# Patient Record
Sex: Male | Born: 1995 | Race: White | Hispanic: Yes | Smoking: Former smoker
Health system: Southern US, Community
[De-identification: ages and names within clinical notes are randomized; demographics above are authoritative.]

## PROBLEM LIST (undated history)

## (undated) DIAGNOSIS — I861 Scrotal varices: Secondary | ICD-10-CM

## (undated) DIAGNOSIS — F32A Depression, unspecified: Secondary | ICD-10-CM

## (undated) DIAGNOSIS — F419 Anxiety disorder, unspecified: Secondary | ICD-10-CM

## (undated) DIAGNOSIS — F909 Attention-deficit hyperactivity disorder, unspecified type: Secondary | ICD-10-CM

## (undated) HISTORY — DX: Attention-deficit hyperactivity disorder, unspecified type: F90.9

## (undated) HISTORY — PX: NO PAST SURGERIES: SHX2092

## (undated) HISTORY — DX: Scrotal varices: I86.1

## (undated) HISTORY — DX: Anxiety disorder, unspecified: F41.9

## (undated) HISTORY — DX: Depression, unspecified: F32.A

---
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
## 2016-11-17 ENCOUNTER — Ambulatory Visit (INDEPENDENT_AMBULATORY_CARE_PROVIDER_SITE_OTHER): Payer: No Typology Code available for payment source | Admitting: Urology

## 2016-11-17 ENCOUNTER — Encounter: Payer: Self-pay | Admitting: Urology

## 2016-11-17 VITALS — BP 120/69 | HR 80 | Ht 70.0 in | Wt 198.0 lb

## 2016-11-17 DIAGNOSIS — R3129 Other microscopic hematuria: Secondary | ICD-10-CM

## 2016-11-17 DIAGNOSIS — N453 Epididymo-orchitis: Secondary | ICD-10-CM | POA: Diagnosis not present

## 2016-11-17 DIAGNOSIS — N5082 Scrotal pain: Secondary | ICD-10-CM

## 2016-11-17 DIAGNOSIS — I861 Scrotal varices: Secondary | ICD-10-CM

## 2016-11-17 LAB — URINALYSIS, COMPLETE
BILIRUBIN UA: NEGATIVE
GLUCOSE, UA: NEGATIVE
KETONES UA: NEGATIVE
Leukocytes, UA: NEGATIVE
NITRITE UA: NEGATIVE
Protein, UA: NEGATIVE
SPEC GRAV UA: 1.025 (ref 1.005–1.030)
UUROB: 0.2 mg/dL (ref 0.2–1.0)
pH, UA: 6 (ref 5.0–7.5)

## 2016-11-17 LAB — MICROSCOPIC EXAMINATION
BACTERIA UA: NONE SEEN
Epithelial Cells (non renal): NONE SEEN /hpf (ref 0–10)

## 2016-11-17 LAB — BLADDER SCAN AMB NON-IMAGING: SCAN RESULT: 17

## 2016-11-17 NOTE — Progress Notes (Signed)
11/17/2016 9:02 AM   Gabriel Cooper 24-Aug-1996 [id number]  Referring provider: Renaee Mundaavid A Stein, MD [address] [place], [place] [address]  Chief Complaint  Patient presents with 20  . New Patient (Initial Visit)    Epididymitis referred by Dr. Meredith ModyStein    HPI: Patient is a 20 year old Hispanic male who is referred by Dr. Meredith ModyStein for left epididymitis.  He has been having intermittent left scrotal pain for the last year.  The pain is becoming more frequent and less tolerable.  He states that his left scrotum is bigger than his right scrotum.  He has not noticed any redness.  He has not had any fevers, chills, nausea or vomiting.    He was last sexually active 3 1/2 months ago.  He did not use protection.  He has not any discharge from the penis or dysuria.  He has not had any gross hematuria or suprapubic pain.    He had seen Dr. Edwyna ShellHart in 2016 for similar complaints.    Patient denies any urinary symptoms at this time.  His UA today was 3-10 RBC's.  His PVR 17 mL.     PMH: No past medical history on file.  Surgical History: Past Surgical History:  Procedure Laterality Date  . NO PAST SURGERIES      Home Medications:    Medication List    as of 11/17/2016  9:02 AM   You have not been prescribed any medications.     Allergies: No Known Allergies  Family History: Family History  Problem Relation Age of Onset  . Prostate cancer Neg Hx   . Kidney cancer Neg Hx   . Bladder Cancer Neg Hx     Social History:  reports that he quit smoking about 1 weeks ago. His smoking use included Cigars. His smokeless tobacco use includes Chew. He reports that he drinks alcohol. He reports that he does not use drugs.  ROS: UROLOGY Frequent Urination?: No Hard to postpone urination?: No Burning/pain with urination?: No Get up at night to urinate?: No Leakage of urine?: No Urine stream starts and stops?: No Trouble starting stream?: No Do you have to strain to urinate?: No Blood in  urine?: No Urinary tract infection?: No Sexually transmitted disease?: No Injury to kidneys or bladder?: No Painful intercourse?: No Weak stream?: No Erection problems?: No Penile pain?: No  Gastrointestinal Nausea?: No Vomiting?: No Indigestion/heartburn?: No Diarrhea?: No Constipation?: No  Constitutional Fever: No Night sweats?: No Weight loss?: No Fatigue?: No  Skin Skin rash/lesions?: No Itching?: No  Eyes Blurred vision?: No Double vision?: No  Ears/Nose/Throat Sore throat?: No Sinus problems?: No  Hematologic/Lymphatic Swollen glands?: No Easy bruising?: No  Cardiovascular Leg swelling?: No Chest pain?: No  Respiratory Cough?: No Shortness of breath?: No  Endocrine Excessive thirst?: No  Musculoskeletal Back pain?: No Joint pain?: No  Neurological Headaches?: No Dizziness?: No  Psychologic Depression?: No Anxiety?: No  Physical Exam: BP 120/69   Pulse 80   Ht 5\' 10"  (1.778 m)   Wt 198 lb (89.8 kg)   BMI 28.41 kg/m   Constitutional: Well nourished. Alert and oriented, No acute distress. HEENT: [place] AT, moist mucus membranes. Trachea midline, no masses. Cardiovascular: No clubbing, cyanosis, or edema. Respiratory: Normal respiratory effort, no increased work of breathing. GI: Abdomen is soft, non tender, non distended, no abdominal masses. Liver and spleen not palpable.  No hernias appreciated.  Stool sample for occult testing is not indicated.   GU: No  CVA tenderness.  No bladder fullness or masses.  Patient with uncircumcised phallus.  Foreskin easily retracted  Urethral meatus is patent.  No penile discharge. No penile lesions or rashes. Scrotum without lesions, cysts, rashes and/or edema.  Testicles are located scrotally bilaterally. No masses are appreciated in the testicles. Left and right epididymis are normal.  Left varicocele is noted.   Rectal: Deferred.  Skin: No rashes, bruises or suspicious lesions. Lymph: No cervical or  inguinal adenopathy. Neurologic: Grossly intact, no focal deficits, moving all 4 extremities. Psychiatric: Normal mood and affect.  Laboratory Data:  Urinalysis 3-10 RBC's.  See EPIC.    Pertinent Imaging: Results for Gabriel Cooper (MRN [id number]) as of 11/17/2016 08:57  Ref. Range 11/17/2016 08:51  Scan Result Unknown 17    Assessment & Plan:    1. Left varicocele  - obtain scrotal ultrasound for conformation  - RTC for scrotal ultrasound report  2. Left scrotal pain  - Urinalysis, Complete  - CULTURE, URINE COMPREHENSIVE  - GC/Chlamydia Probe Amp  - BLADDER SCAN AMB NON-IMAGING  3. Microscopic hematuria  - urine sent for culture  - recheck UA upon RTC   Return for RTC for scrotal ultrasound report.  These notes generated with voice recognition software. I apologize for typographical errors.  Michiel CowboySHANNON Davielle Lingelbach, PA-C  [facility] [address] [place], [place] [address] [phone]

## 2016-11-19 LAB — GC/CHLAMYDIA PROBE AMP
CHLAMYDIA, DNA PROBE: NEGATIVE
Neisseria gonorrhoeae by PCR: NEGATIVE

## 2016-11-23 ENCOUNTER — Telehealth: Payer: Self-pay

## 2016-11-23 DIAGNOSIS — N390 Urinary tract infection, site not specified: Secondary | ICD-10-CM

## 2016-11-23 LAB — CULTURE, URINE COMPREHENSIVE

## 2016-11-23 MED ORDER — AMOXICILLIN-POT CLAVULANATE 875-125 MG PO TABS: 1.0000 | ORAL_TABLET | Freq: Two times a day (BID) | ORAL | 0 refills | Status: AC

## 2016-11-23 NOTE — Telephone Encounter (Signed)
-----   Message from Harle BattiestShannon A McGowan, PA-C sent at 11/23/2016  8:04 AM EST ----- Patient has a +UCx.  They need to start Augmentin 875/125, one tablets twice daily for seven days.  They also need to take a probiotic with the antibiotic course.  The dosage is listed below:  L. acidophilus and L. casei (25 x 109 CFU/day for 2 days, then 50 x 109 CFU/day for duration of the antibiotic course)

## 2016-11-23 NOTE — Telephone Encounter (Signed)
Spoke with pt in reference to +ucx. Made aware abx was sent to pharmacy. Pt voiced understanding.  

## 2016-11-24 ENCOUNTER — Ambulatory Visit
Admission: RE | Admit: 2016-11-24 | Discharge: 2016-11-24 | Disposition: A | Discharge status: Home / Self Care | Payer: No Typology Code available for payment source | Source: Ambulatory Visit | Attending: Urology | Admitting: Urology

## 2016-11-24 DIAGNOSIS — I861 Scrotal varices: Secondary | ICD-10-CM | POA: Insufficient documentation

## 2016-12-11 NOTE — Progress Notes (Deleted)
12/12/2016 6:04 PM   Gabriel Cooper [birth year] [id number]  Referring provider: Renaee Munda, MD [address] [place], [place] [address]  No chief complaint on file.   HPI: Patient is a 21 year old Hispanic male who presents today to discuss scrotal ultrasound results.   Background history  Patient was referred by Dr. Meredith Mody for left epididymitis.  He has been having intermittent left scrotal pain for the last year.  The pain is becoming more frequent and less tolerable.  He states that his left scrotum is bigger than his right scrotum.  He has not noticed any redness.  He has not had any fevers, chills, nausea or vomiting.  He was last sexually active 3 1/2 months ago.  He did not use protection.  He has not any discharge from the penis or dysuria.  He has not had any gross hematuria or suprapubic pain.  He had seen Dr. Edwyna Shell in 2016 for similar complaints.  Patient denies any urinary symptoms at this time.   His UA was 3-10 RBC's.  His PVR was 17 mL.    His GC and chlamydia cultures were negative from the last visit. He was found to have a positive urine culture for Staphylococcus hominis and was given 7 days of Augmentin.  Scrotal ultrasound from 11/24/2016 noted a moderate left varicocele, otherwise negative exam.  I have independently reviewed the films.  Today, he ***.        PMH: No past medical history on file.  Surgical History: Past Surgical History:  Procedure Laterality Date  . NO PAST SURGERIES      Home Medications:  Allergies as of 12/12/2016   No Known Allergies     Medication List    as of 12/11/2016  6:04 PM   You have not been prescribed any medications.     Allergies: No Known Allergies  Family History: Family History  Problem Relation Age of Onset  . Prostate cancer Neg Hx   . Kidney cancer Neg Hx   . Bladder Cancer Neg Hx     Social History:  reports that he quit smoking about 5 weeks ago. His smoking use included Cigars. His smokeless  tobacco use includes Chew. He reports that he drinks alcohol. He reports that he does not use drugs.  ROS:                                        Physical Exam: There were no vitals taken for this visit.  Constitutional: Well nourished. Alert and oriented, No acute distress. HEENT: [place] AT, moist mucus membranes. Trachea midline, no masses. Cardiovascular: No clubbing, cyanosis, or edema. Respiratory: Normal respiratory effort, no increased work of breathing. GI: Abdomen is soft, non tender, non distended, no abdominal masses. Liver and spleen not palpable.  No hernias appreciated.  Stool sample for occult testing is not indicated.   GU: No CVA tenderness.  No bladder fullness or masses.  Patient with uncircumcised phallus.  Foreskin easily retracted  Urethral meatus is patent.  No penile discharge. No penile lesions or rashes. Scrotum without lesions, cysts, rashes and/or edema.  Testicles are located scrotally bilaterally. No masses are appreciated in the testicles. Left and right epididymis are normal.  Left varicocele is noted.   Rectal: Deferred.  Skin: No rashes, bruises or suspicious lesions. Lymph: No cervical or inguinal adenopathy. Neurologic: Grossly intact, no focal  deficits, moving all 4 extremities. Psychiatric: Normal mood and affect.  Laboratory Data: Urinalysis  ***    See EPIC.    Pertinent Imaging: CLINICAL DATA:  Pain.  EXAM: SCROTAL ULTRASOUND  DOPPLER ULTRASOUND OF THE TESTICLES  TECHNIQUE: Complete ultrasound examination of the testicles, epididymis, and other scrotal structures was performed. Color and spectral Doppler ultrasound were also utilized to evaluate blood flow to the testicles.  COMPARISON:  No prior.  FINDINGS: Right testicle  Measurements: 4.5 x 2.1 x 3.2 cm. No mass or microlithiasis visualized.  Left testicle  Measurements: 4.4 x 2.3 x 3.1 cm. No mass. Punctate calcifications noted in the left  testicle. These nonspecific.  Right epididymis:  Normal in size and appearance.  Left epididymis:  Normal in size and appearance.  Hydrocele:  None visualized.  Varicocele:  Moderate left varicocele.  Pulsed Doppler interrogation of both testes demonstrates normal low resistance arterial and venous waveforms bilaterally.  IMPRESSION: Moderate left varicocele, otherwise negative exam.   Electronically Signed   By: Maisie Fushomas  Register   On: 11/24/2016 14:52  Assessment & Plan:    1. Left varicocele  - scrotal ultrasound confirms left varicocele  - explained to patient how this may affect fertility  - obtain semen analysis  2. Left scrotal pain  - Urinalysis, Complete   3. UTI  -   4. Microscopic hematuria  - Urinalysis, Complete     No Follow-up on file.  These notes generated with voice recognition software. I apologize for typographical errors.  Michiel CowboySHANNON Aalyssa Elderkin, PA-C  [facility] [address], [address] [place], [place] [address] [phone]

## 2016-12-12 ENCOUNTER — Ambulatory Visit: Payer: No Typology Code available for payment source | Admitting: Urology

## 2016-12-12 ENCOUNTER — Telehealth: Payer: Self-pay | Admitting: Urology

## 2016-12-12 NOTE — Telephone Encounter (Signed)
Patient has a varicocele and this can affect fertility.   I would like him to have a semen analysis.  He also had blood in his urine and I would like to check an UA to make sure that has cleared.

## 2016-12-12 NOTE — Telephone Encounter (Signed)
Pt called and left message to cancel his appt for today for scrotal u/s report.  He didn't leave a message to reschedule.

## 2016-12-12 NOTE — Telephone Encounter (Signed)
Spoke with pt who stated he has rescheduled his appt for 1/10 @ 2:45. Nurse confirmed appt.

## 2016-12-14 ENCOUNTER — Ambulatory Visit: Payer: No Typology Code available for payment source | Admitting: Urology

## 2016-12-22 ENCOUNTER — Ambulatory Visit: Payer: No Typology Code available for payment source | Admitting: Urology

## 2017-01-02 ENCOUNTER — Encounter: Payer: Self-pay | Admitting: Urology

## 2017-01-02 ENCOUNTER — Ambulatory Visit: Payer: No Typology Code available for payment source | Admitting: Urology

## 2017-01-03 ENCOUNTER — Telehealth: Payer: Self-pay

## 2017-01-03 NOTE — Telephone Encounter (Signed)
As per my previous note, patient has a varicocele and this can affect fertility.   I would like him to have a semen analysis.  He also had blood in his urine and I would like to check an UA to make sure that has cleared.

## 2017-01-03 NOTE — Telephone Encounter (Signed)
Pt called stating he missed his results appt yesterday and requested to get scrotal u/s results via telephone. Please advise.

## 2017-01-04 ENCOUNTER — Ambulatory Visit: Payer: No Typology Code available for payment source | Admitting: Urology

## 2017-01-04 NOTE — Telephone Encounter (Signed)
Spoke with pt in reference to varicocele and hematuria. Pt voiced understanding stating he wanted to make an appt to [facility]. Pt was transferred to the front to make f/u appt.

## 2017-01-09 ENCOUNTER — Encounter: Payer: Self-pay | Admitting: Urology

## 2017-01-09 ENCOUNTER — Ambulatory Visit (INDEPENDENT_AMBULATORY_CARE_PROVIDER_SITE_OTHER): Payer: Medicaid Other | Admitting: Urology

## 2017-01-09 VITALS — BP 128/61 | HR 70 | Ht 70.0 in | Wt 194.8 lb

## 2017-01-09 DIAGNOSIS — N5082 Scrotal pain: Secondary | ICD-10-CM | POA: Diagnosis not present

## 2017-01-09 DIAGNOSIS — I861 Scrotal varices: Secondary | ICD-10-CM | POA: Diagnosis not present

## 2017-01-09 DIAGNOSIS — R3129 Other microscopic hematuria: Secondary | ICD-10-CM

## 2017-01-09 LAB — MICROSCOPIC EXAMINATION
BACTERIA UA: NONE SEEN
Epithelial Cells (non renal): NONE SEEN /hpf (ref 0–10)
WBC UA: NONE SEEN /HPF (ref 0–?)

## 2017-01-09 LAB — URINALYSIS, COMPLETE
Bilirubin, UA: NEGATIVE
GLUCOSE, UA: NEGATIVE
Ketones, UA: NEGATIVE
LEUKOCYTES UA: NEGATIVE
Nitrite, UA: NEGATIVE
PH UA: 7.5 (ref 5.0–7.5)
PROTEIN UA: NEGATIVE
Specific Gravity, UA: 1.01 (ref 1.005–1.030)
UUROB: 0.2 mg/dL (ref 0.2–1.0)

## 2017-01-09 NOTE — Progress Notes (Signed)
01/09/2017 3:00 PM   Gabriel Cooper 12/01/1996 [id number]  Referring provider: Renaee Munda, MD [address] [place], [place] [address]  Chief Complaint  Patient presents with  . Follow-up    Vericocele    HPI: Patient is a 21 year old Hispanic male who presents today to discuss scrotal ultrasound results.   Background history  Patient was referred by Dr. Meredith Mody for left epididymitis.  He has been having intermittent left scrotal pain for the last year.  The pain is becoming more frequent and less tolerable.  He states that his left scrotum is bigger than his right scrotum.  He has not noticed any redness.  He has not had any fevers, chills, nausea or vomiting.  He was last sexually active 3 1/2 months ago.  He did not use protection.  He has not any discharge from the penis or dysuria.  He has not had any gross hematuria or suprapubic pain.  He had seen Dr. Edwyna Shell in 2016 for similar complaints.  Patient denies any urinary symptoms at this time.   His UA was 3-10 RBC's.  His PVR was 17 mL.    His GC and chlamydia cultures were negative from the last visit. He was found to have a positive urine culture for Staphylococcus hominis and was given 7 days of Augmentin.  Scrotal ultrasound from 11/24/2016 noted a moderate left varicocele, otherwise negative exam.  I have independently reviewed the films.  Today, he continues to have microscopic hematuria with 3-10 RBC's.  He is having pain in his scrotum.          PMH: No past medical history on file.  Surgical History: Past Surgical History:  Procedure Laterality Date  . NO PAST SURGERIES      Home Medications:  Allergies as of 01/09/2017   No Known Allergies     Medication List    as of 01/09/2017  3:00 PM   You have not been prescribed any medications.     Allergies: No Known Allergies  Family History: Family History  Problem Relation Age of Onset  . Prostate cancer Neg Hx   . Kidney cancer Neg Hx   . Bladder  Cancer Neg Hx     Social History:  reports that he quit smoking about 2 months ago. His smoking use included Cigars. His smokeless tobacco use includes Chew. He reports that he drinks alcohol. He reports that he does not use drugs.  ROS: UROLOGY Frequent Urination?: No Hard to postpone urination?: No Burning/pain with urination?: No Get up at night to urinate?: No Leakage of urine?: No Urine stream starts and stops?: No Trouble starting stream?: No Do you have to strain to urinate?: No Blood in urine?: Yes Urinary tract infection?: No Sexually transmitted disease?: No Injury to kidneys or bladder?: No Painful intercourse?: No Weak stream?: No Erection problems?: No Penile pain?: No  Gastrointestinal Nausea?: No Vomiting?: No Indigestion/heartburn?: No Diarrhea?: No Constipation?: No  Constitutional Fever: No Night sweats?: No Weight loss?: No Fatigue?: No  Skin Skin rash/lesions?: No Itching?: No  Eyes Blurred vision?: No Double vision?: No  Ears/Nose/Throat Sore throat?: No Sinus problems?: No  Hematologic/Lymphatic Swollen glands?: No Easy bruising?: No  Cardiovascular Leg swelling?: No Chest pain?: No  Respiratory Cough?: No Shortness of breath?: No  Endocrine Excessive thirst?: No  Musculoskeletal Back pain?: No Joint pain?: No  Neurological Headaches?: No Dizziness?: No  Psychologic Depression?: No Anxiety?: No  Physical Exam: BP 128/61 (BP Location: Left  Arm, Patient Position: Sitting, Cuff Size: Normal)   Pulse 70   Ht 5\' 10"  (1.778 m)   Wt 194 lb 12.8 oz (88.4 kg)   BMI 27.95 kg/m   Constitutional: Well nourished. Alert and oriented, No acute distress. HEENT:  AT, moist mucus membranes. Trachea midline, no masses. Cardiovascular: No clubbing, cyanosis, or edema. Respiratory: Normal respiratory effort, no increased work of breathing. GI: Abdomen is soft, non tender, non distended, no abdominal masses. Liver and spleen  not palpable.  No hernias appreciated.  Stool sample for occult testing is not indicated.   GU: No CVA tenderness.  No bladder fullness or masses.   Skin: No rashes, bruises or suspicious lesions. Lymph: No cervical or inguinal adenopathy. Neurologic: Grossly intact, no focal deficits, moving all 4 extremities. Psychiatric: Normal mood and affect.  Laboratory Data: Urinalysis  3-10 RBC's.  See EPIC.    Pertinent Imaging: CLINICAL DATA:  Pain.  EXAM: SCROTAL ULTRASOUND  DOPPLER ULTRASOUND OF THE TESTICLES  TECHNIQUE: Complete ultrasound examination of the testicles, epididymis, and other scrotal structures was performed. Color and spectral Doppler ultrasound were also utilized to evaluate blood flow to the testicles.  COMPARISON:  No prior.  FINDINGS: Right testicle  Measurements: 4.5 x 2.1 x 3.2 cm. No mass or microlithiasis visualized.  Left testicle  Measurements: 4.4 x 2.3 x 3.1 cm. No mass. Punctate calcifications noted in the left testicle. These nonspecific.  Right epididymis:  Normal in size and appearance.  Left epididymis:  Normal in size and appearance.  Hydrocele:  None visualized.  Varicocele:  Moderate left varicocele.  Pulsed Doppler interrogation of both testes demonstrates normal low resistance arterial and venous waveforms bilaterally.  IMPRESSION: Moderate left varicocele, otherwise negative exam.   Electronically Signed   By: Maisie Fushomas  Register   On: 11/24/2016 14:52  Assessment & Plan:    1. Left varicocele  - scrotal ultrasound confirms left varicocele  - explained to patient how this may affect fertility  - obtain semen analysis  2. Left scrotal pain  - Urinalysis, Complete   3. UTI  - resolved  4. Microscopic hematuria  - Urinalysis, Complete- continues to have AMH  - obtain a RUS     Return if symptoms worsen or fail to improve.  These notes generated with voice recognition software. I apologize for  typographical errors.  Michiel CowboySHANNON Laverta Harnisch, PA-C  [facility] [address], [address] [place], [place] [address] [phone]

## 2017-01-17 ENCOUNTER — Ambulatory Visit: Payer: Medicaid Other

## 2018-12-03 ENCOUNTER — Emergency Department: Payer: BLUE CROSS/BLUE SHIELD

## 2018-12-03 ENCOUNTER — Encounter: Payer: Self-pay | Admitting: Emergency Medicine

## 2018-12-03 ENCOUNTER — Emergency Department
Admission: EM | Admit: 2018-12-03 | Discharge: 2018-12-03 | Disposition: A | Discharge status: Home / Self Care | Payer: BLUE CROSS/BLUE SHIELD | Attending: Emergency Medicine | Admitting: Emergency Medicine

## 2018-12-03 DIAGNOSIS — W260XXA Contact with knife, initial encounter: Secondary | ICD-10-CM | POA: Insufficient documentation

## 2018-12-03 DIAGNOSIS — Y929 Unspecified place or not applicable: Secondary | ICD-10-CM | POA: Insufficient documentation

## 2018-12-03 DIAGNOSIS — Y9389 Activity, other specified: Secondary | ICD-10-CM | POA: Insufficient documentation

## 2018-12-03 DIAGNOSIS — S61412A Laceration without foreign body of left hand, initial encounter: Secondary | ICD-10-CM | POA: Insufficient documentation

## 2018-12-03 DIAGNOSIS — Y998 Other external cause status: Secondary | ICD-10-CM | POA: Diagnosis not present

## 2018-12-03 DIAGNOSIS — Z87891 Personal history of nicotine dependence: Secondary | ICD-10-CM | POA: Insufficient documentation

## 2018-12-03 DIAGNOSIS — T1490XA Injury, unspecified, initial encounter: Secondary | ICD-10-CM

## 2018-12-03 MED ORDER — LIDOCAINE HCL 1 % IJ SOLN
5.0000 mL | Freq: Once | INTRAMUSCULAR | Status: AC
  Administered 2018-12-03: 5 mL
  Filled 2018-12-03: qty 10

## 2018-12-03 MED ORDER — CEPHALEXIN 500 MG PO CAPS: 500.0000 mg | ORAL_CAPSULE | Freq: Three times a day (TID) | ORAL | 0 refills | Status: AC

## 2018-12-03 NOTE — ED Triage Notes (Addendum)
Pt with approximate 2 inch laceration over the left knuckles after opening box with kitchen steak knife. Bleeding controled. Bandsage applied.

## 2018-12-03 NOTE — ED Notes (Signed)
Reference triage note. Pt currently in NAD. This RN will continue to monitor.

## 2018-12-03 NOTE — ED Provider Notes (Signed)
[facility] Emergency Department Provider Note  ____________________________________________  Time seen: Approximately 11:15 PM  I have reviewed the triage vital signs and the nursing notes.   HISTORY  Chief Complaint Laceration    HPI Gabriel Cooper is a 22 y.o. male presents to the emergency department with a 5 cm laceration along the dorsal aspect of the left hand after patient accidentally cut himself with a clean knife while trying to open a Christmas present.  Patient has been able to move hand since incident occurred.  No numbness or tingling.  Patient's tetanus status is up-to-date.   History reviewed. No pertinent past medical history.  There are no active problems to display for this patient.   Past Surgical History:  Procedure Laterality Date  . NO PAST SURGERIES      Prior to Admission medications   Medication Sig Start Date End Date Taking? Authorizing Provider  cephALEXin (KEFLEX) 500 MG capsule Take 1 capsule (500 mg total) by mouth 3 (three) times daily for 7 days. 12/03/18 12/10/18  Orvil FeilWoods, Mcihael Hinderman M, PA-C    Allergies Patient has no known allergies.  Family History  Problem Relation Age of Onset  . Prostate cancer Neg Hx   . Kidney cancer Neg Hx   . Bladder Cancer Neg Hx     Social History Social History   Tobacco Use  . Smoking status: Former Smoker    Types: Cigars    Last attempt to quit: 11/04/2016    Years since quitting: 2.0  . Smokeless tobacco: Current User    Types: Chew  Substance Use Topics  . Alcohol use: Yes    Alcohol/week: 0.0 standard drinks    Comment: occ  . Drug use: No     Review of Systems  Constitutional: No fever/chills Eyes: No visual changes. No discharge ENT: No upper respiratory complaints. Cardiovascular: no chest pain. Respiratory: no cough. No SOB. Gastrointestinal: No abdominal pain.  No nausea, no vomiting.  No diarrhea.  No constipation. Musculoskeletal: Negative for musculoskeletal  pain. Skin: Patient has left hand laceration.  Neurological: Negative for headaches, focal weakness or numbness.   ____________________________________________   PHYSICAL EXAM:  VITAL SIGNS: ED Triage Vitals  Enc Vitals Group     BP 12/03/18 2021 117/70     Pulse Rate 12/03/18 2021 65     Resp 12/03/18 2021 17     Temp 12/03/18 2021 98 F (36.7 C)     Temp Source 12/03/18 2021 Oral     SpO2 12/03/18 2021 99 %     Weight 12/03/18 2022 215 lb (97.5 kg)     Height 12/03/18 2022 5\' 10"  (1.778 m)     Head Circumference --      Peak Flow --      Pain Score --      Pain Loc --      Pain Edu? --      Excl. in GC? --      Constitutional: Alert and oriented. Well appearing and in no acute distress. Eyes: Conjunctivae are normal. PERRL. EOMI. Head: Atraumatic. Cardiovascular: Normal rate, regular rhythm. Normal S1 and S2.  Good peripheral circulation. Respiratory: Normal respiratory effort without tachypnea or retractions. Lungs CTAB. Good air entry to the bases with no decreased or absent breath sounds. Musculoskeletal: Full range of motion to all extremities.  No flexor or extensor tendon deficits appreciated with testing.  No gross deformities appreciated.  Palpable radial pulse bilaterally and symmetrically. Neurologic:  Normal speech and  language. No gross focal neurologic deficits are appreciated.  Skin: Patient has 5 cm linear laceration along the dorsal aspect of the left hand. Psychiatric: Mood and affect are normal. Speech and behavior are normal. Patient exhibits appropriate insight and judgement.   ____________________________________________   LABS (all labs ordered are listed, but only abnormal results are displayed)  Labs Reviewed - No data to display ____________________________________________  EKG   ____________________________________________  RADIOLOGY I personally viewed and evaluated these images as part of my medical decision making, as well as  reviewing the written report by the radiologist.  Dg Hand Complete Left  Result Date: 12/03/2018 CLINICAL DATA:  Laceration EXAM: LEFT HAND - COMPLETE 3+ VIEW COMPARISON:  None. FINDINGS: Frontal, oblique, and lateral views obtained. No fracture or dislocation. Joint spaces appear normal. No erosive change. No soft tissue air or radiopaque foreign body. IMPRESSION: No soft tissue air or radiopaque foreign body. No fracture or dislocation. No evident arthropathy. Electronically Signed   By: Bretta BangWilliam  Woodruff III M.D.   On: 12/03/2018 20:48    ____________________________________________    PROCEDURES  Procedure(s) performed:    Procedures  LACERATION REPAIR Performed by: Orvil FeilJaclyn M Anadelia Kintz Authorized by: Orvil FeilJaclyn M Shacoya Burkhammer Consent: Verbal consent obtained. Risks and benefits: risks, benefits and alternatives were discussed Consent given by: patient Patient identity confirmed: provided demographic data Prepped and Draped in normal sterile fashion Wound explored  Laceration Location: Left hand   Laceration Length: 5 cm  No Foreign Bodies seen or palpated  Anesthesia: local infiltration  Local anesthetic: lidocaine 1% without epinephrine  Anesthetic total: 5 ml  Irrigation method: syringe Amount of cleaning: standard  Skin closure: 4-0 Ethilon   Number of sutures: 13  Technique: Simple Interrupted   Patient tolerance: Patient tolerated the procedure well with no immediate complications.   Medications  lidocaine (XYLOCAINE) 1 % (with pres) injection 5 mL (5 mLs Infiltration Given by Other 12/03/18 2314)     ____________________________________________   INITIAL IMPRESSION / ASSESSMENT AND PLAN / ED COURSE  Pertinent labs & imaging results that were available during my care of the patient were reviewed by me and considered in my medical decision making (see chart for details).  Review of the [place] CSRS was performed in accordance of the [facility] prior to dispensing any  controlled drugs.      Assessment and plan Laceration repair Patient presents to the emergency department with a 5 cm linear laceration along the dorsal aspect of the left hand sustained accidentally with a knife.  Patient underwent laceration repair in the emergency department without complication.  He was advised to have sutures removed by primary care in 1 week.  Patient was discharged with Keflex.  All patient questions were answered.    ____________________________________________  FINAL CLINICAL IMPRESSION(S) / ED DIAGNOSES  Final diagnoses:  Laceration of left hand without foreign body, initial encounter      NEW MEDICATIONS STARTED DURING THIS VISIT:  ED Discharge Orders         Ordered    cephALEXin (KEFLEX) 500 MG capsule  3 times daily     12/03/18 2312              This chart was dictated using voice recognition software/Dragon. Despite best efforts to proofread, errors can occur which can change the meaning. Any change was purely unintentional.    Orvil FeilWoods, Meri Pelot M, PA-C 12/03/18 2320    Jeanmarie PlantMcShane, James A, MD 12/03/18 [id number]

## 2019-11-15 ENCOUNTER — Ambulatory Visit: Payer: Self-pay | Admitting: Family Medicine

## 2019-12-03 ENCOUNTER — Ambulatory Visit: Payer: Self-pay | Admitting: Family Medicine

## 2019-12-12 ENCOUNTER — Ambulatory Visit: Payer: Self-pay | Admitting: Family Medicine

## 2020-08-02 ENCOUNTER — Encounter: Payer: Self-pay | Admitting: Emergency Medicine

## 2020-08-02 ENCOUNTER — Ambulatory Visit: Admission: EM | Admit: 2020-08-02 | Discharge: 2020-08-02 | Disposition: A | Discharge status: Home / Self Care

## 2020-08-02 ENCOUNTER — Other Ambulatory Visit: Payer: Self-pay

## 2020-08-02 DIAGNOSIS — T148XXA Other injury of unspecified body region, initial encounter: Secondary | ICD-10-CM

## 2020-08-02 DIAGNOSIS — M7918 Myalgia, other site: Secondary | ICD-10-CM

## 2020-08-02 MED ORDER — IBUPROFEN 600 MG PO TABS: 600.0000 mg | ORAL_TABLET | Freq: Four times a day (QID) | ORAL | 0 refills | Status: DC | PRN

## 2020-08-02 MED ORDER — CYCLOBENZAPRINE HCL 10 MG PO TABS: 10.0000 mg | ORAL_TABLET | Freq: Two times a day (BID) | ORAL | 0 refills | Status: DC | PRN
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
## 2021-12-21 ENCOUNTER — Encounter: Payer: Self-pay | Admitting: Family Medicine

## 2021-12-21 ENCOUNTER — Ambulatory Visit
Admission: RE | Admit: 2021-12-21 | Discharge: 2021-12-21 | Disposition: A | Discharge status: Home / Self Care | Payer: 59 | Source: Ambulatory Visit | Attending: Family Medicine | Admitting: Family Medicine

## 2021-12-21 ENCOUNTER — Ambulatory Visit
Admission: RE | Admit: 2021-12-21 | Discharge: 2021-12-21 | Disposition: A | Discharge status: Home / Self Care | Payer: 59 | Attending: Family Medicine | Admitting: Family Medicine

## 2021-12-21 ENCOUNTER — Ambulatory Visit: Payer: 59 | Admitting: Family Medicine

## 2021-12-21 ENCOUNTER — Other Ambulatory Visit: Payer: Self-pay

## 2021-12-21 VITALS — BP 120/68 | HR 88 | Ht 70.0 in | Wt 186.0 lb

## 2021-12-21 DIAGNOSIS — M5416 Radiculopathy, lumbar region: Secondary | ICD-10-CM

## 2021-12-21 DIAGNOSIS — I861 Scrotal varices: Secondary | ICD-10-CM | POA: Diagnosis not present

## 2021-12-21 MED ORDER — MELOXICAM 15 MG PO TABS: 15.0000 mg | ORAL_TABLET | Freq: Every day | ORAL | 0 refills | Status: DC

## 2021-12-21 NOTE — Assessment & Plan Note (Signed)
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  He has noted intermittent pain with this not tied to any specific activity, denies any masses, mild radiation to the thighs, no discoloration or trauma that he recalls.  Examination does reveal increased varicosity, palpation of the left testes without discrete masses, mildly tender, Valsalva without herniations appreciable with mild increase in vein dilatation.  I have advised further evaluation management by urology, referral was placed in that regard today.  We will follow peripherally on this issue.

## 2021-12-21 NOTE — Progress Notes (Signed)
°  ° °  Primary Care / Sports Medicine Office Visit  Patient Information:  Patient ID: Gabriel Cooper, male DOB: [birth year] Age: 26 y.o. MRN: [id number]   Gabriel Cooper is a pleasant 25 y.o. male presenting with the following:  Chief Complaint  Patient presents with   New Patient (Initial Visit)   Establish Care   Back Pain    Vitals:   12/21/21 1504  BP: 120/68  Pulse: 88  SpO2: 97%   Vitals:   12/21/21 1504  Weight: 186 lb (84.4 kg)  Height: 5\' 10"  (1.778 m)   Body mass index is 26.69 kg/m.  No results found.   Independent interpretation of notes and tests performed by another provider:   None  Procedures performed:   None  Pertinent History, Exam, Impression, and Recommendations:   Lumbar radiculopathy Patient with atraumatic roughly 2-year history of symmetric lower back pain with intermittent radiation down the legs.  This pain is aggravated by strenuous activity, lifting, and bending forward at the waist.  Denies any treatments to date.  Examination shows tenderness focal to the left SI joint, positive straight leg raise bilaterally, otherwise benign.  Given patient stated history, findings today, concern for intervertebral disc disorder with associated intermittent neural impingement.  Plan for lumbar spine x-rays, scheduled anti-inflammatory meloxicam for 4 weeks, and close follow-up at that time.  Pending x-ray findings and response, we can determine next steps such as rehab, local injections, advanced imaging.  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  He has noted intermittent pain with this not tied to any specific activity, denies any masses, mild radiation to the thighs, no discoloration or trauma that he recalls.  Examination does reveal increased varicosity, palpation of the left testes without discrete masses, mildly  tender, Valsalva without herniations appreciable with mild increase in vein dilatation.  I have advised further evaluation management by urology, referral was placed in that regard today.  We will follow peripherally on this issue.   Orders & Medications Meds ordered this encounter  Medications   meloxicam (MOBIC) 15 MG tablet    Sig: Take 1 tablet (15 mg total) by mouth daily.    Dispense:  30 tablet    Refill:  0   Orders Placed This Encounter  Procedures   DG Lumbar Spine Complete   Ambulatory referral to Urology     Return in about 4 weeks (around 01/18/2022).     01/20/2022, MD   Primary Care Sports Medicine [facility]

## 2021-12-21 NOTE — Assessment & Plan Note (Signed)
Patient with atraumatic roughly 2-year history of symmetric lower back pain with intermittent radiation down the legs.  This pain is aggravated by strenuous activity, lifting, and bending forward at the waist.  Denies any treatments to date.  Examination shows tenderness focal to the left SI joint, positive straight leg raise bilaterally, otherwise benign.  Given patient stated history, findings today, concern for intervertebral disc disorder with associated intermittent neural impingement.  Plan for lumbar spine x-rays, scheduled anti-inflammatory meloxicam for 4 weeks, and close follow-up at that time.  Pending x-ray findings and response, we can determine next steps such as rehab, local injections, advanced imaging.

## 2021-12-21 NOTE — Patient Instructions (Signed)
-   Obtain low back x-rays - Start meloxicam, take once daily with food - Avoid excessive bending at the waist, twisting, turning activities at the low back - Monitor symptoms/symptom change at the low back and groin - Referral coordinator will contact you follow-up with urology - Return for follow-up in 4 weeks

## 2022-01-11 NOTE — Progress Notes (Incomplete)
° °  01/11/22 4:45 PM   Gabriel Cooper [birth year] [id number]  Referring provider:  Montel Culver, MD [address] [place],  [place] [address] No chief complaint on file.    HPI: Gabriel Cooper is a 26 y.o.male who presents today for further evaluation of variocele.   Scrotal ultrasound from 11/24/2016 noted a moderate left varicocele, otherwise negative exam.  He was previously referred by Dr. Delice Lesch for left epididymitis. He was seen by Zara Council, PA-C.       PMH: Past Medical History:  Diagnosis Date   ADHD (attention deficit hyperactivity disorder)    Anxiety    Depression    Varicocele     Surgical History: No past surgical history on file.  Home Medications:  Allergies as of 01/12/2022   No Known Allergies      Medication List        Accurate as of January 11, 2022  4:45 PM. If you have any questions, ask your nurse or doctor.          meloxicam 15 MG tablet Commonly known as: MOBIC Take 1 tablet (15 mg total) by mouth daily.   VYVANSE PO Take 30 mg by mouth daily.        Allergies: No Known Allergies  Family History: Family History  Problem Relation Age of Onset   Breast cancer Mother    Breast cancer Maternal Grandmother    Parkinson's disease Paternal Grandfather    Allergies Daughter    Prostate cancer Neg Hx    Kidney cancer Neg Hx    Bladder Cancer Neg Hx     Social History:  reports that he quit smoking about 5 years ago. His smoking use included cigars. He has quit using smokeless tobacco.  His smokeless tobacco use included chew. He reports current alcohol use of about 2.0 standard drinks per week. He reports that he does not currently use drugs.   Physical Exam: There were no vitals taken for this visit.  Constitutional:  Alert and oriented, No acute distress. HEENT: [place] AT, moist mucus membranes.  Trachea midline, no masses. Cardiovascular: No clubbing, cyanosis, or edema. Respiratory: Normal respiratory  effort, no increased work of breathing. GU: No CVA tenderness Skin: No rashes, bruises or suspicious lesions. Neurologic: Grossly intact, no focal deficits, moving all 4 extremities. Psychiatric: Normal mood and affect.   Urinalysis   Pertinent Imaging:    Assessment & Plan:     No follow-ups on file.  I,Kailey Littlejohn,acting as a Education administrator for Hollice Espy, MD.,have documented all relevant documentation on the behalf of Hollice Espy, MD,as directed by  Hollice Espy, MD while in the presence of Hollice Espy, [place] [address] [place], [place] [address] [phone]

## 2022-01-12 ENCOUNTER — Ambulatory Visit: Payer: 59 | Admitting: Urology

## 2022-01-18 ENCOUNTER — Ambulatory Visit: Payer: 59 | Admitting: Family Medicine

## 2022-01-18 ENCOUNTER — Other Ambulatory Visit: Payer: Self-pay

## 2022-01-18 ENCOUNTER — Encounter: Payer: Self-pay | Admitting: Family Medicine

## 2022-01-18 VITALS — BP 122/86 | HR 88 | Ht 70.0 in | Wt 188.0 lb

## 2022-01-18 DIAGNOSIS — M5416 Radiculopathy, lumbar region: Secondary | ICD-10-CM

## 2022-01-18 NOTE — Patient Instructions (Addendum)
-   Referral coordinator will contact you in regards to physical therapy scheduling, alternatively can contact the number below for expedited scheduling - Dose meloxicam once daily on an as-needed basis (take with food) - Contact urology to coordinate another follow-up visit - Perform physical therapy exercises and home exercises daily, review your current workout routine with physical therapist - Contact us for any questions or concerns - Return for follow-up in 6 weeks  [facility]:  [place]:  [phone]  [place]: [phone]

## 2022-01-18 NOTE — Progress Notes (Signed)
°  ° °  Primary Care / Sports Medicine Office Visit  Patient Information:  Patient ID: Gabriel Cooper, male DOB: Jul 19, 1996 Age: 26 y.o. MRN: [id number]   Gabriel Cooper is a pleasant 26 y.o. male presenting with the following:  Chief Complaint  Patient presents with   Lumbar radiculopathy    Vitals:   01/18/22 1558  BP: 122/86  Pulse: 88  SpO2: 97%   Vitals:   01/18/22 1558  Weight: 188 lb (85.3 kg)  Height: 5\' 10"  (1.778 m)   Body mass index is 26.98 kg/m.  DG Lumbar Spine Complete  Result Date: 12/22/2021 CLINICAL DATA:  Left-sided back pain for 2 years, initial encounter EXAM: LUMBAR SPINE - COMPLETE 4+ VIEW COMPARISON:  None. FINDINGS: Vertebral body height is well maintained. No pars defects are noted. No anterolisthesis is seen. No soft tissue abnormality is noted. No acute fracture noted. IMPRESSION: No acute abnormality noted. Electronically Signed   By: 12/24/2021 M.D.   On: 12/22/2021 02:40     Independent interpretation of notes and tests performed by another provider:   Independent interpretation of lumbar spine x-rays dated 12/22/2021 reveals subtle left-sided curvature on AP view, secondary to asymmetric muscular spasm versus positioning, otherwise no intervertebral narrowing, facet pathologies noted.  Procedures performed:   None  Pertinent History, Exam, Impression, and Recommendations:   Lumbar radiculopathy Patient is for follow-up to atraumatic roughly 2-year history of symmetric lower back pain with intermittent lower leg radiation/paresthesias.  At the last visit on 12/21/2021 he was advised scheduled anti-inflammatory meloxicam for 4 weeks, lumbar spine x-rays, and close follow-up.  He was able to dose the meloxicam consistently for 3 weeks, did note improvement though without resolution of his symptoms.  His examination today shows improved though persistent focal left SI joint tenderness, positive straight leg raise bilaterally, and otherwise stable.   Lumbar spine films are reassuring with subtle curvature noted on AP view which can be consistent with asymmetric muscular spasm versus positioning.  Given his persistent symptomatology have advised a transition from scheduled meloxicam to as needed dosing, start of formal physical therapy x6 weeks, and a return at the end of PT.  If suboptimal response noted despite adherence to this plan, anticipate advanced imaging and further medication management.  Chronic condition, symptomatic, independent radiographic interpretation, Rx management   Orders & Medications No orders of the defined types were placed in this encounter.  Orders Placed This Encounter  Procedures   Ambulatory referral to Physical Therapy     Return in about 6 weeks (around 03/01/2022).     03/03/2022, MD   Primary Care Sports Medicine [facility]

## 2022-01-18 NOTE — Assessment & Plan Note (Signed)
Patient is for follow-up to atraumatic roughly 2-year history of symmetric lower back pain with intermittent lower leg radiation/paresthesias.  At the last visit on 12/21/2021 he was advised scheduled anti-inflammatory meloxicam for 4 weeks, lumbar spine x-rays, and close follow-up.  He was able to dose the meloxicam consistently for 3 weeks, did note improvement though without resolution of his symptoms.  His examination today shows improved though persistent focal left SI joint tenderness, positive straight leg raise bilaterally, and otherwise stable.  Lumbar spine films are reassuring with subtle curvature noted on AP view which can be consistent with asymmetric muscular spasm versus positioning.  Given his persistent symptomatology have advised a transition from scheduled meloxicam to as needed dosing, start of formal physical therapy x6 weeks, and a return at the end of PT.  If suboptimal response noted despite adherence to this plan, anticipate advanced imaging and further medication management.  Chronic condition, symptomatic, independent radiographic interpretation, Rx management

## 2022-01-26 ENCOUNTER — Ambulatory Visit: Payer: 59

## 2022-01-31 ENCOUNTER — Ambulatory Visit: Payer: 59

## 2022-01-31 ENCOUNTER — Ambulatory Visit: Payer: 59 | Attending: Family Medicine

## 2022-02-02 ENCOUNTER — Ambulatory Visit: Payer: 59

## 2022-02-08 NOTE — Progress Notes (Addendum)
Patient was not seen on this day, canceled his appointment.

## 2022-02-09 ENCOUNTER — Ambulatory Visit (INDEPENDENT_AMBULATORY_CARE_PROVIDER_SITE_OTHER): Payer: 59 | Admitting: Urology

## 2022-02-09 DIAGNOSIS — Z91199 Patient's noncompliance with other medical treatment and regimen due to unspecified reason: Secondary | ICD-10-CM

## 2022-02-13 NOTE — Progress Notes (Incomplete)
? ?  02/13/22 ?6:26 PM  ? ?Sharen Hint ?[birth year] ?[id number] ? ?Referring provider:  ?Montel Culver, MD ?[address] ?[address] ?[place],  [place] [address] ?No chief complaint on file. ? ? ? ?HPI: ?Gabriel Cooper is a 26 y.o.male who presents today for further evaluation of variocele.  ? ?He has a personal history of epididymitis, scrotal pain, left variocele, and microscopic hematuria.  ? ?He was last seen in clinic in 2018 by Zara Council, PA-C, at the time he was having increased left scrotal pain. He had seen Dr. Elnoria Howard in 2016 for similar complaints. Scrotal ultrasound from 11/24/2016 noted a moderate left varicocele, otherwise negative exam.  ? ?   ? ? ? ? ? ?PMH: ?Past Medical History:  ?Diagnosis Date  ? ADHD (attention deficit hyperactivity disorder)   ? Anxiety   ? Depression   ? Varicocele   ? ? ?Surgical History: ?No past surgical history on file. ? ?Home Medications:  ?Allergies as of 02/15/2022   ?No Known Allergies ?  ? ?  ?Medication List  ?  ? ?  ? Accurate as of February 13, 2022  6:26 PM. If you have any questions, ask your nurse or doctor.  ?  ?  ? ?  ? ?meloxicam 15 MG tablet ?Commonly known as: MOBIC ?Take 1 tablet (15 mg total) by mouth daily. ?  ?VYVANSE PO ?Take 30 mg by mouth daily. ?  ? ?  ? ? ?Allergies: No Known Allergies ? ?Family History: ?Family History  ?Problem Relation Age of Onset  ? Breast cancer Mother   ? Breast cancer Maternal Grandmother   ? Parkinson's disease Paternal Grandfather   ? Allergies Daughter   ? Prostate cancer Neg Hx   ? Kidney cancer Neg Hx   ? Bladder Cancer Neg Hx   ? ? ?Social History:  reports that he quit smoking about 5 years ago. His smoking use included cigars. He has quit using smokeless tobacco.  His smokeless tobacco use included chew. He reports current alcohol use of about 2.0 standard drinks per week. He reports that he does not currently use drugs. ? ? ?Physical Exam: ?There were no vitals taken for this visit.  ?Constitutional:  Alert and oriented,  No acute distress. ?HEENT: [place] AT, moist mucus membranes.  Trachea midline, no masses. ?Cardiovascular: No clubbing, cyanosis, or edema. ?Respiratory: Normal respiratory effort, no increased work of breathing. ?Skin: No rashes, bruises or suspicious lesions. ?Neurologic: Grossly intact, no focal deficits, moving all 4 extremities. ?Psychiatric: Normal mood and affect. ? ?Laboratory Data: ? ?No results found for: CREATININE ? ?No results found for: PSA ? ?No results found for: TESTOSTERONE ? ?No results found for: HGBA1C ? ?Urinalysis ? ? ?Pertinent Imaging: ? ? ? ?Assessment & Plan:   ? ? ?No follow-ups on file. ? ?I,Kailey Littlejohn,acting as a scribe for Hollice Espy, MD.,have documented all relevant documentation on the behalf of Hollice Espy, MD,as directed by  Hollice Espy, MD while in the presence of Hollice Espy, MD. ? ?[place] ?[address] ?[place],  [address] ?[phone] ? ?

## 2022-02-15 ENCOUNTER — Ambulatory Visit: Payer: 59 | Admitting: Urology

## 2022-02-21 ENCOUNTER — Ambulatory Visit: Payer: 59 | Attending: Family Medicine

## 2022-02-23 ENCOUNTER — Ambulatory Visit: Payer: 59

## 2022-03-01 ENCOUNTER — Ambulatory Visit: Payer: 59 | Admitting: Family Medicine

## 2022-03-08 ENCOUNTER — Ambulatory Visit: Payer: 59 | Attending: Family Medicine

## 2022-03-08 DIAGNOSIS — M5416 Radiculopathy, lumbar region: Secondary | ICD-10-CM | POA: Diagnosis present

## 2022-03-08 DIAGNOSIS — M5459 Other low back pain: Secondary | ICD-10-CM | POA: Diagnosis present

## 2022-03-08 NOTE — Therapy (Signed)
[place] ?[facility] ?[address] ?[place], [place], [address] ?Phone: [phone]   Fax:  [phone] ? ?Physical Therapy Evaluation ? ?Patient Details  ?Name: Gabriel Cooper ?MRN: [id number] ?Date of Birth: 1996/06/02 ?Referring Provider (PT): Jerrol Banana, MD ? ? ?Encounter Date: 03/08/2022 ? ? PT End of Session - 03/08/22 1549   ? ? Visit Number 1   ? Number of Visits 9   ? Date for PT Re-Evaluation 05/05/22   ? PT Start Time 1549   ? PT Stop Time 1636   ? PT Time Calculation (min) 47 min   ? ?  ?  ? ?  ? ? ?Past Medical History:  ?Diagnosis Date  ? ADHD (attention deficit hyperactivity disorder)   ? Anxiety   ? Depression   ? Varicocele   ? ? ?No past surgical history on file. ? ?There were no vitals filed for this visit. ? ? ? Subjective Assessment - 03/08/22 1553   ? ? Subjective Low back: 0/10 currently, 8/10 at worst for the past 3 months.   ? Pertinent History Lumbar radiculopathy. 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 Pain got progressively worse. Has gotten better lately due to stretches. Used to have really sharp pain in his back when he bends over. Not as bad now. Stretches include standing bent over stretch, quad stretch standing, deep squats, hamstring and piriformis and butter fly stretches. Sometimes gets tingling L medial thigh. No paresthesia at gluteal cleft and no loss of bowel or bladder control. L LE paresthesia includes L medial thigh to back of knee to L lateral leg (around L4-5 dermatomes). Most of his pain is at his L low back.   ? Patient Stated Goals Learn better stretches to help with back pain.   ? Currently in Pain? No/denies   ? Pain Location Back   ? Pain Orientation Left;Lower;Posterior   ? Pain Descriptors / Indicators Aching;Sharp   ? Pain Type Chronic pain   ? Pain Onset More than a month ago   ? Pain Frequency Occasional   ? Aggravating Factors  bending over with his back,  slouching which sitting. Pain is worst in the morning (prone or L S/L)   ? Pain Relieving Factors sleeping on his back   ? ?  ?  ? ?  ? ? ? ? ? [facility] PT Assessment - 03/08/22 1605   ? ?  ? Assessment  ? Medical Diagnosis M54.16 (ICD-10-CM)  Lumbar radiculopathy   ? Referring Provider (PT) Jerrol Banana, MD   ? Onset Date/Surgical Date 01/18/22   Date PT referral signed. Chronic condition  ? Prior Therapy No known PT for current condition   ?  ? Precautions  ? Precaution Comments No known precautions   ?  ? Restrictions  ? Other Position/Activity Restrictions No known restrictions   ?  ? Observation/Other Assessments  ? Focus on Therapeutic Outcomes (FOTO)  Lumbar spine FOTO 62   ?  ? Posture/Postural Control  ? Posture Comments Forward neck, slight R lateral shift, B protracted shoulders, R iliac crest and greater trochanter lower, R hip ER   ?  ? AROM  ? Lumbar Flexion full   ? Lumbar Extension WFL   No pain  ? Lumbar - Right Side [facility] with slight L low back symptoms.   ? Lumbar - Left Side [facility] with slight symptoms   ?  Lumbar - Right Rotation WFL with L low back tightness   ? Lumbar - Left Rotation WFL with increased L low back pain   ?  ? Strength  ? Right Hip Flexion 4/5   ? Right Hip Extension 4-/5   with slight R low back pain  ? Left Hip Flexion 4/5   ? Left Hip Extension 4-/5   with L low back pain  ? Right Knee Flexion 5/5   ? Right Knee Extension 5/5   ? Left Knee Flexion 5/5   ? Left Knee Extension 5/5   ?  ? Palpation  ? Palpation comment TTP L L5 TP to L L1 TP. TTP to S1 CPA   ?  ? Special Tests  ? Other special tests (-) repeated flexion test. (+) long sit test suggesting anterior nutation of L innominate.  (+) Slump test L LE with reproduction of symptoms.   ? ?  ?  ? ?  ? ? ? ? ? ? ? ? ? ? ? ? ? ?Objective measurements completed on examination: See above findings.  ? ? ? ? ? ?No latex allergies ? ?Sitting posture: slight R lumbar convexity.  ? ?Extension preference ? ? ? ? ?Therapeutic  exercise ? ?Supine L hip extension isometrics in SKTC position 10x5 seconds for 2 sets ? ?Improved exercise technique, movement at target joints, use of target muscles after mod verbal, visual, tactile cues.  ? ?Reviewed POC 1x/week for 8 weeks due to schedule ?  ? ? ?Response to treatment ? ?Back feels better after session per pt ? ? ? ?Clinical impression ?Pt is a 26 year old male who came to physical therapy secondary to chronic low back pain. He also presents with altered posture, TTP to low back, bilateral glute weakness, positive special tests suggesting lumbopelvic involvement and neural tension, and difficulty performing tasks which involve bending over as well as sitting for longer periods secondary to low back pain. Pt will benefit from skilled physical therapy services to address the aforementioned deficits.  ? ? ? ? ? ? ? ? ? ? ?Access Code: [email or web address] ?URL: [email or web address] ?Date: 03/08/2022 ?Prepared by: Loralyn Freshwater ? ?Exercises ?- supine single knee to chest position with hip extension isometrics  - 1 x daily - 7 x weekly - 3 sets - 10 reps - 5 seconds  hold ? ? ? ? ? ? ? ? ? ? ? ? ? ? PT Education - 03/08/22 1752   ? ? Education Details ther-ex, HEP, POC   ? Person(s) Educated Patient   ? Methods Explanation;Demonstration;Tactile cues;Verbal cues;Handout   ? Comprehension Returned demonstration;Verbalized understanding   ? ?  ?  ? ?  ? ? ? PT Short Term Goals - 03/08/22 1747   ? ?  ? PT SHORT TERM GOAL #1  ? Title Pt will be independent with his initial HEP to decrease pain, improve strength and function.   ? Baseline Pt has started his HEP (03/08/2022)   ? Time 3   ? Period Weeks   ? Status New   ? Target Date 03/31/22   ? ?  ?  ? ?  ? ? ? ? PT Long Term Goals - 03/08/22 1748   ? ?  ? PT LONG TERM GOAL #1  ? Title Pt will have a decrease in low back pain to 3/10 or less at worst to promote ability to lift, as well as sit for longer periods.   ? Baseline 8/10  low back pain at worst  for the past 3 months (03/08/2022)   ? Time 8   ? Period Weeks   ? Status New   ? Target Date 05/05/22   ?  ? PT LONG TERM GOAL #2  ? Title Pt will improve his lumbar spine FOTO score by at least 10 points as a demonstration of improved function.   ? Baseline Lumbar spine FOTO 62 (03/08/2022)   ? Time 8   ? Period Weeks   ? Status New   ? Target Date 05/05/22   ?  ? PT LONG TERM GOAL #3  ? Title Pt will improve bilateral glute max strength by at least 1 MMT to promote ability to lift items more comfortably for his back.   ? Baseline Hip extension 4-/5 Rand L (03/08/2022)   ? Time 8   ? Period Weeks   ? Status New   ? Target Date 05/05/22   ? ?  ?  ? ?  ? ? ? ? ? ? ? ? ? Plan - 03/08/22 1741   ? ? Clinical Impression Statement Pt is a 26 year old male who came to physical therapy secondary to chronic low back pain. He also presents with altered posture, TTP to low back, bilateral glute weakness, positive special tests suggesting lumbopelvic involvement and neural tension, and difficulty performing tasks which involve bending over as well as sitting for longer periods secondary to low back pain. Pt will benefit from skilled physical therapy services to address the aforementioned deficits.   ? Personal Factors and Comorbidities Comorbidity 3+   ? Comorbidities ADHD, anxiety, depression   ? Examination-Activity Limitations Lift;Bend;Sit   ? Stability/Clinical Decision Making Stable/Uncomplicated   ? Clinical Decision Making Low   ? Rehab Potential Fair   ? PT Frequency 1x / week   ? PT Duration 8 weeks   ? PT Treatment/Interventions Therapeutic activities;Therapeutic exercise;Neuromuscular re-education;Patient/family education;Manual techniques;Dry needling;Spinal Manipulations;Joint Manipulations;Aquatic Therapy;Electrical Stimulation;Iontophoresis 4mg /ml Dexamethasone;Traction;Ultrasound   ? PT Next Visit Plan posture, trunk and hip strengthening, manual techniques, modalities PRN   ? PT Home Exercise Plan Medbridge    Access Code: [email or web address]   ? Consulted and Agree with Plan of Care Patient   ? ?  ?  ? ?  ? ? ?Patient will benefit from skilled therapeutic intervention in order to improve the following deficits and impairments:  Danielle DessPai

## 2022-03-10 ENCOUNTER — Ambulatory Visit: Payer: 59

## 2022-03-15 ENCOUNTER — Ambulatory Visit: Payer: 59

## 2022-03-15 ENCOUNTER — Telehealth: Payer: Self-pay

## 2022-03-15 NOTE — Telephone Encounter (Signed)
No show. Called pt who said that he is still at work and forgot. Will be able to make it to his next appointment 03/17/22 Thursday at 4:30 pm. Will let us know if the appointment after that will work.  ?

## 2022-03-17 ENCOUNTER — Ambulatory Visit: Payer: 59

## 2022-03-31 ENCOUNTER — Ambulatory Visit: Payer: 59 | Admitting: Family Medicine

## 2022-04-05 ENCOUNTER — Ambulatory Visit: Payer: 59 | Admitting: Family Medicine

## 2022-04-12 ENCOUNTER — Ambulatory Visit (INDEPENDENT_AMBULATORY_CARE_PROVIDER_SITE_OTHER): Payer: 59 | Admitting: Family Medicine

## 2022-04-12 ENCOUNTER — Encounter: Payer: Self-pay | Admitting: Family Medicine

## 2022-04-12 ENCOUNTER — Ambulatory Visit: Payer: Self-pay | Admitting: *Deleted

## 2022-04-12 VITALS — BP 120/80 | HR 84 | Ht 70.0 in | Wt 186.4 lb

## 2022-04-12 DIAGNOSIS — F419 Anxiety disorder, unspecified: Secondary | ICD-10-CM | POA: Diagnosis not present

## 2022-04-12 DIAGNOSIS — F32A Depression, unspecified: Secondary | ICD-10-CM | POA: Insufficient documentation

## 2022-04-12 MED ORDER — HYDROXYZINE PAMOATE 25 MG PO CAPS: 25.0000 mg | ORAL_CAPSULE | Freq: Three times a day (TID) | ORAL | 0 refills | Status: DC | PRN

## 2022-04-12 NOTE — Telephone Encounter (Signed)
?  Chief Complaint: "Maybe panic attack" ?Symptoms: "Stressful day" States was working out, felt "Very stiff all over, all my joints" Reports SOB, dizziness, tingling both arms. States symptoms lasted 1 hour "I just sat in my friends car." Symptoms presently ,headache, lower back pain "Stiffness in my joints and constipation." States was staying hydrated. "I may have had a mild heart attack" Denies CP, tightness ?Frequency: Yesterday ?Pertinent Negatives: Patient denies  ?Disposition: [] ED /[] Urgent Care (no appt availability in office) / [x] Appointment(In office/virtual)/ []  [facility] [place] [facility]/ [] Home Care/ [] Refused Recommended Disposition /[] [facility]/ []  Follow-up with PCP ?Additional Notes: Appt secured for today. ?Reason for Disposition ? Symptoms interfere with work or school ? ?Answer Assessment - Initial Assessment Questions ?1. CONCERN: "Did anything happen that prompted you to call today?"  ?    "Maybe a panic attack yesterday" ?2. ANXIETY SYMPTOMS: "Can you describe how you (your loved one; patient) have been feeling?" (e.g., tense, restless, panicky, anxious, keyed up, overwhelmed, sense of impending doom).  ?    Anxious ?3. ONSET: "How long have you been feeling this way?" (e.g., hours, days, weeks) ?     ?4. SEVERITY: "How would you rate the level of anxiety?" (e.g., 0 - 10; or mild, moderate, severe). ?    Not presently ?5. FUNCTIONAL IMPAIRMENT: "How have these feelings affected your ability to do daily activities?" "Have you had more difficulty than usual doing your normal daily activities?" (e.g., getting better, same, worse; self-care, school, work, interactions) ?    yes ?6. HISTORY: "Have you felt this way before?" "Have you ever been diagnosed with an anxiety problem in the past?" (e.g., generalized anxiety disorder, panic attacks, PTSD). If Yes, ask: "How was this problem treated?" (e.g., medicines, counseling, etc.) ?    *No Answer* ?7. RISK OF HARM - SUICIDAL IDEATION: "Do  you ever have thoughts of hurting or killing yourself?" If Yes, ask:  "Do you have these feelings now?" "Do you have a plan on how you would do this?" ?     ?8. TREATMENT:  "What has been done so far to treat this anxiety?" (e.g., medicines, relaxation strategies). "What has helped?" ?     ?9. TREATMENT - THERAPIST: "Do you have a counselor or therapist? Name?" ?     ?10. POTENTIAL TRIGGERS: "Do you drink caffeinated beverages (e.g., coffee, colas, teas), and how much daily?" "Do you drink alcohol or use any drugs" "Have you started any new medicines recently?" ?     ?10. PATIENT SUPPORT: "Who is with you now?" "Who do you live with?" "Do you have family or friends who you can talk to?"  ?      yes ?11. OTHER SYMPTOMS: "Do you have any other symptoms?" (e.g., feeling depressed, trouble concentrating, trouble sleeping, trouble breathing, palpitations or fast heartbeat, chest pain, sweating, nausea, or diarrhea) ?"Stiff all over, saw stars,dizzy, SOB for 1 hour" ? ?Protocols used: Anxiety and Panic Attack-A-AH ? ?

## 2022-04-12 NOTE — Progress Notes (Signed)
?  ? ?  Primary Care / Sports Medicine Office Visit ? ?Patient Information:  ?Patient ID: Gabriel Cooper, male DOB: May 18, 1996 Age: 26 y.o. MRN: [id number]  ? ?Gabriel Cooper is a pleasant 26 y.o. male presenting with the following: ? ?Chief Complaint  ?Patient presents with  ? Stress  ?  Pt states that he has been stressed because of court and custody of his daughter. Hasn't been able to have a bowel movement in a couple days, was able to today many times after OTC laxatives, is going to school part time and working full time.  Pt states today he started feeling hot and felt like he was having a panic attack, is now very fatigued.  ? ? ?Vitals:  ? 04/12/22 1456  ?BP: 120/80  ?Pulse: 84  ?SpO2: 97%  ? ?Vitals:  ? 04/12/22 1456  ?Weight: 186 lb 6.4 oz (84.6 kg)  ?Height: 5\' 10"  (1.778 m)  ? ?Body mass index is 26.75 kg/m?. ? ?No results found.  ? ?Independent interpretation of notes and tests performed by another provider:  ? ?None ? ?Procedures performed:  ? ?None ? ?Pertinent History, Exam, Impression, and Recommendations:  ? ?Problem List Items Addressed This Visit   ? ?  ? Other  ? Anxiety and depression - Primary  ?  Patient with history of ADHD presenting with acute on chronic panic attack episode.  Most recent was yesterday 04/11/2022, has had few other episodes in the past, last one roughly 1 year prior.  This episode occurred while he was at the gym, began to note heart racing, had mild central discomfort, hyperventilating, had paresthesias in bilateral upper extremities, went to his car to sit and drink water with gradual improvement.  He has had similar symptoms to a lesser extent in the past.  He does state that he has been under a lot of stress due to life stressors (familial and work-related).  Additionally, he has been dealing with constipation. ? ?Examination reveals no JVD, symmetric pulses in the extremities, positive S1 and S2, regular rate and rhythm, no additional heart sounds, clear lung fields  throughout without wheezes, rales, rhonchi, no carotid bruits, no peripheral edema.  His vitals are reassuring. ? ?Patient's clinical history and findings raise concern for anxiety/depression at baseline given his elevated PHQ and GAD scores, superimposed panic attack episode.  Cardiovascular neurology considered and I did offer referral to cardiology, he has deferred this at this stage.  From a treatment standpoint, we discussed both pharmacologic and nonpharmacologic options, he is amenable to further evaluation by psychiatry, as needed hydroxyzine, and we will reevaluate the patient in 2 months. ? ?Acute on chronic, Rx management ? ?  ?  ? Relevant Medications  ? hydrOXYzine (VISTARIL) 25 MG capsule  ? Other Relevant Orders  ? Ambulatory referral to Psychiatry  ?  ? ?Orders & Medications ?Meds ordered this encounter  ?Medications  ? hydrOXYzine (VISTARIL) 25 MG capsule  ?  Sig: Take 1 capsule (25 mg total) by mouth every 8 (eight) hours as needed for anxiety.  ?  Dispense:  30 capsule  ?  Refill:  0  ? ?Orders Placed This Encounter  ?Procedures  ? Ambulatory referral to Psychiatry  ?  ? ?Return in about 2 months (around 06/12/2022).  ?  ? ?08/13/2022, MD ? ? Primary Care Sports Medicine ?[facility] ?[facility]  ? ?

## 2022-04-12 NOTE — Patient Instructions (Signed)
-   Referral coronary will contact in regards to setting a visit with psychiatry ?- Review information provided regarding nonmedication based ways to manage her symptoms ?- Can dose Vistaril (hydroxyzine) every hours on as-needed basis for panic attacks ?- Return for follow-up in 2 months ?

## 2022-04-12 NOTE — Assessment & Plan Note (Signed)
Patient with history of ADHD presenting with acute on chronic panic attack episode.  Most recent was yesterday 04/11/2022, has had few other episodes in the past, last one roughly 1 year prior.  This episode occurred while he was at the gym, began to note heart racing, had mild central discomfort, hyperventilating, had paresthesias in bilateral upper extremities, went to his car to sit and drink water with gradual improvement.  He has had similar symptoms to a lesser extent in the past.  He does state that he has been under a lot of stress due to life stressors (familial and work-related).  Additionally, he has been dealing with constipation. ? ?Examination reveals no JVD, symmetric pulses in the extremities, positive S1 and S2, regular rate and rhythm, no additional heart sounds, clear lung fields throughout without wheezes, rales, rhonchi, no carotid bruits, no peripheral edema.  His vitals are reassuring. ? ?Patient's clinical history and findings raise concern for anxiety/depression at baseline given his elevated PHQ and GAD scores, superimposed panic attack episode.  Cardiovascular neurology considered and I did offer referral to cardiology, he has deferred this at this stage.  From a treatment standpoint, we discussed both pharmacologic and nonpharmacologic options, he is amenable to further evaluation by psychiatry, as needed hydroxyzine, and we will reevaluate the patient in 2 months. ? ?Acute on chronic, Rx management ?

## 2022-04-12 NOTE — Telephone Encounter (Signed)
Pt has appointment today.  

## 2022-04-14 ENCOUNTER — Encounter: Payer: Self-pay | Admitting: Family Medicine

## 2022-06-08 IMAGING — CR DG LUMBAR SPINE COMPLETE 4+V
5 series · 5 of 5 positions shown · non-contrast
Comparison: None.

CLINICAL DATA: Left-sided back pain for 2 years, initial encounter

EXAM:
LUMBAR SPINE - COMPLETE 4+ VIEW

[l-spine ap]
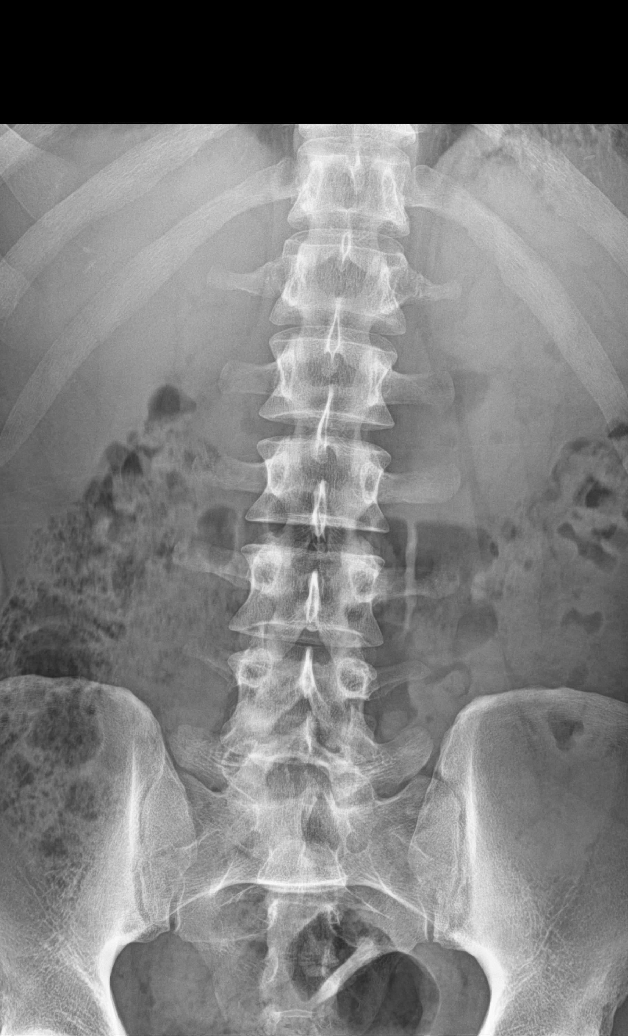

[l-spine obl (1 of 2)]
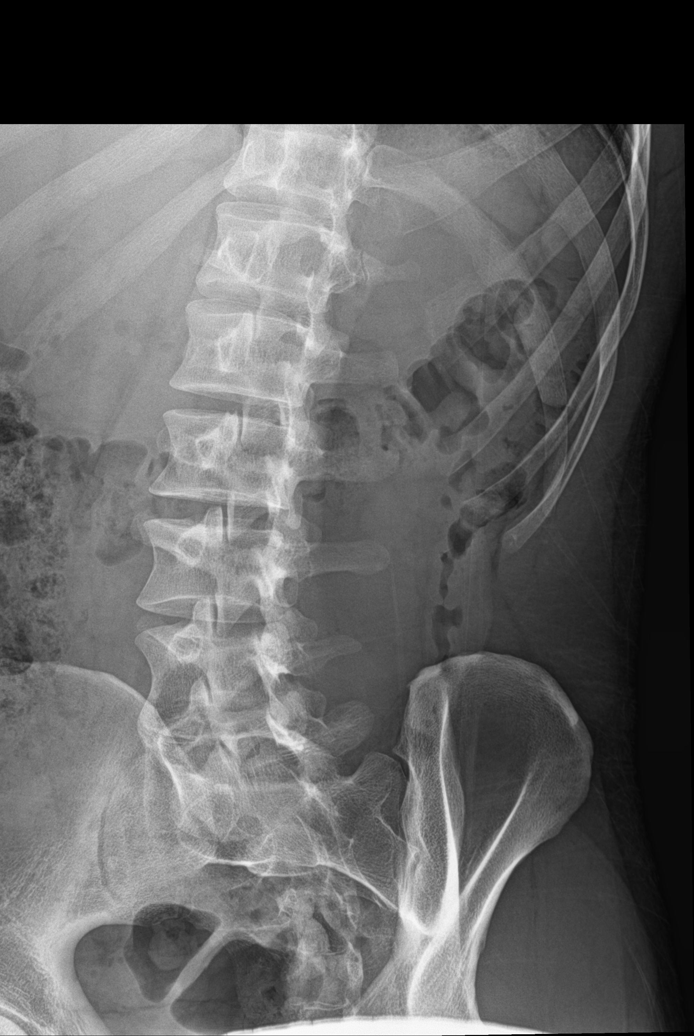

[l-spine obl (2 of 2)]
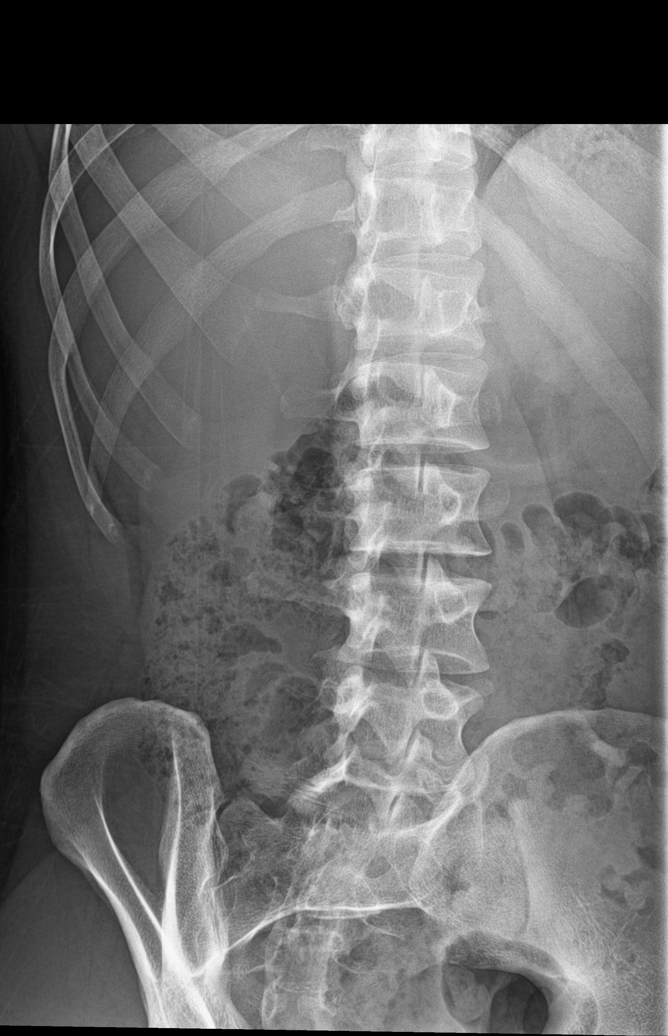

[l-spine lat]
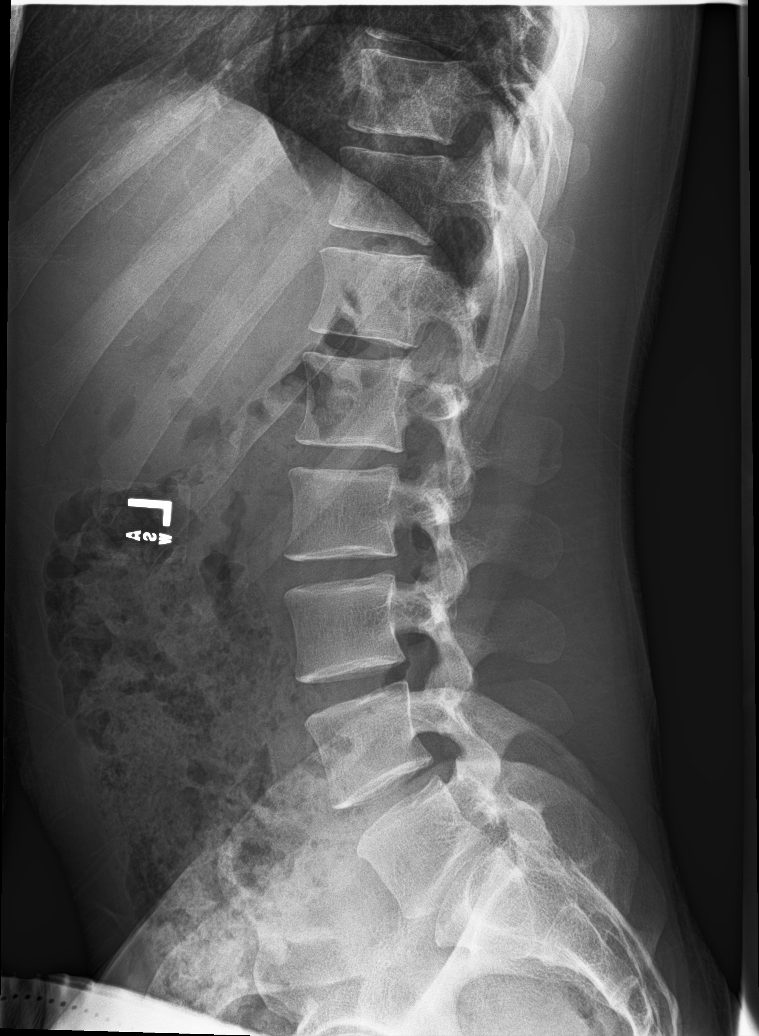

[l-spine spot]
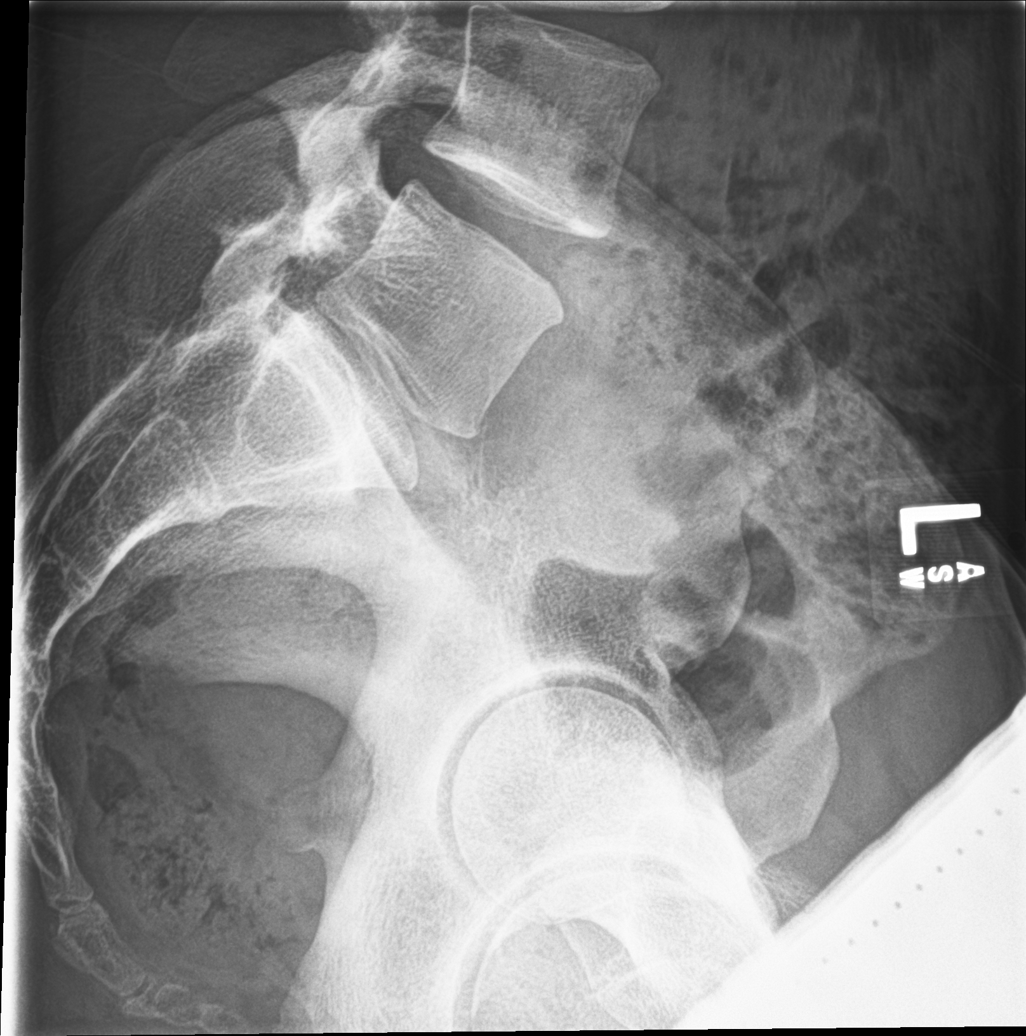

[5 of 5 positions shown; findings below may reference images not displayed]

FINDINGS: Vertebral body height is well maintained. No pars defects are noted.
No anterolisthesis is seen. No soft tissue abnormality is noted. No
acute fracture noted.
IMPRESSION: No acute abnormality noted.

## 2022-06-13 ENCOUNTER — Ambulatory Visit: Payer: 59 | Admitting: Family Medicine

## 2022-06-23 ENCOUNTER — Ambulatory Visit: Payer: 59 | Admitting: Family Medicine

## 2022-06-29 ENCOUNTER — Ambulatory Visit: Payer: 59 | Admitting: Family Medicine

## 2022-09-16 ENCOUNTER — Ambulatory Visit: Payer: 59 | Admitting: Family Medicine

## 2022-09-16 ENCOUNTER — Encounter: Payer: Self-pay | Admitting: Family Medicine

## 2022-09-16 VITALS — BP 122/78 | HR 77 | Ht 70.0 in | Wt 193.0 lb

## 2022-09-16 DIAGNOSIS — F419 Anxiety disorder, unspecified: Secondary | ICD-10-CM | POA: Diagnosis not present

## 2022-09-16 DIAGNOSIS — F32A Depression, unspecified: Secondary | ICD-10-CM

## 2022-09-16 DIAGNOSIS — Z7251 High risk heterosexual behavior: Secondary | ICD-10-CM | POA: Insufficient documentation

## 2022-09-16 DIAGNOSIS — I861 Scrotal varices: Secondary | ICD-10-CM | POA: Diagnosis not present

## 2022-09-16 DIAGNOSIS — R590 Localized enlarged lymph nodes: Secondary | ICD-10-CM | POA: Diagnosis not present

## 2022-09-16 DIAGNOSIS — M5416 Radiculopathy, lumbar region: Secondary | ICD-10-CM

## 2022-09-16 MED ORDER — SULFAMETHOXAZOLE-TRIMETHOPRIM 800-160 MG PO TABS: 1.0000 | ORAL_TABLET | Freq: Two times a day (BID) | ORAL | 0 refills | Status: DC

## 2022-09-16 NOTE — Assessment & Plan Note (Signed)
See additional assessment(s) for plan details. 

## 2022-09-16 NOTE — Progress Notes (Signed)
     Primary Care / Sports Medicine Office Visit  Patient Information:  Patient ID: Gabriel Cooper, male DOB: Jun 04, 1996 Age: 26 y.o. MRN: [id number]   Gabriel Cooper is a pleasant 26 y.o. male presenting with the following:  Chief Complaint  Patient presents with   Mass    X 2 days, Bump on leg, painful, tender to touch, redness, burning, left leg , groin area, no medication or cream tried    STD testing    Had unprotected sex 2 weeks ago     Vitals:   09/16/22 1559  BP: 122/78  Pulse: 77  SpO2: 97%   Vitals:   09/16/22 1559  Weight: 193 lb (87.5 kg)  Height: 5\' 10"  (1.778 m)   Body mass index is 27.69 kg/m.  No results found.   Independent interpretation of notes and tests performed by another provider:   None  Procedures performed:   None  Pertinent History, Exam, Impression, and Recommendations:   Problem List Items Addressed This Visit       Cardiovascular and Mediastinum   Varicocele    Chronic issue, prior referral to urology has been placed, new referral placed today.      Relevant Orders   Ambulatory referral to Urology     Nervous and Auditory   Lumbar radiculopathy    See additional assessment(s) for plan details.        Immune and Lymphatic   Inguinal lymphadenopathy     Other   Anxiety and depression    Chronic issue, significant recent life stressors (Family Court, etc.), extensive discussion regarding pharmacologic versus nonpharmacologic treatment options.  At this stage patient will focus on nonpharmacologic management strategies, is interested in cognitive behavioral therapy, referral placed today.      Relevant Orders   Ambulatory referral to Psychology   Unprotected sexual intercourse - Primary    2 weeks prior, denies any urinary symptoms, has noted roughly 1 week history of left inguinal discomfort with lesion 2 days prior.  Examination without any overt testicular involvement, shotty right inguinal lymphadenopathy, roughly 2 cm  longitudinal tender lymph node at the left inguinal region, no penile or scrotal lesions.  Plan for labs, Bactrim DS x10 days, referral to urology.      Relevant Orders   HIV Antibody (routine testing w rflx)   GC/Chlamydia Probe Amp   RPR   HSV 2 antibody, IgG   UA/M w/rflx Culture, Comp     Orders & Medications Meds ordered this encounter  Medications   sulfamethoxazole-trimethoprim (BACTRIM DS) 800-160 MG tablet    Sig: Take 1 tablet by mouth 2 (two) times daily.    Dispense:  20 tablet    Refill:  0   Orders Placed This Encounter  Procedures   GC/Chlamydia Probe Amp   HIV Antibody (routine testing w rflx)   RPR   HSV 2 antibody, IgG   UA/M w/rflx Culture, Comp   Ambulatory referral to Psychology   Ambulatory referral to Urology     Return in about 3 months (around 12/17/2022) for CPE.     Montel Culver, MD   Primary Care Sports Medicine [place]

## 2022-09-16 NOTE — Assessment & Plan Note (Signed)
Chronic issue, prior referral to urology has been placed, new referral placed today.

## 2022-09-16 NOTE — Assessment & Plan Note (Signed)
Chronic issue, significant recent life stressors (Family Court, etc.), extensive discussion regarding pharmacologic versus nonpharmacologic treatment options.  At this stage patient will focus on nonpharmacologic management strategies, is interested in cognitive behavioral therapy, referral placed today.

## 2022-09-16 NOTE — Assessment & Plan Note (Signed)
2 weeks prior, denies any urinary symptoms, has noted roughly 1 week history of left inguinal discomfort with lesion 2 days prior.  Examination without any overt testicular involvement, shotty right inguinal lymphadenopathy, roughly 2 cm longitudinal tender lymph node at the left inguinal region, no penile or scrotal lesions.  Plan for labs, Bactrim DS x10 days, referral to urology.

## 2022-09-16 NOTE — Patient Instructions (Addendum)
-   Take Bactrim DS (antibiotic) twice daily x10 days - Our office will reach out with lab results once available - Referral coordinator will contact you to schedule visits with urology and therapist - Return for follow-up in 3 months

## 2022-09-19 ENCOUNTER — Encounter: Payer: Self-pay | Admitting: Urology

## 2022-09-19 LAB — RPR: RPR Ser Ql: NONREACTIVE

## 2022-09-19 LAB — HIV ANTIBODY (ROUTINE TESTING W REFLEX): HIV Screen 4th Generation wRfx: NONREACTIVE

## 2022-09-19 LAB — HSV-2 AB, IGG: HSV 2 IgG, Type Spec: <0.91 index (ref 0.00–0.90)

## 2022-09-19 LAB — UA/M W/RFLX CULTURE, COMP
Bilirubin, UA: NEGATIVE
Glucose, UA: NEGATIVE
Ketones, UA: NEGATIVE
Leukocytes,UA: NEGATIVE
Nitrite, UA: NEGATIVE
Protein,UA: NEGATIVE
RBC, UA: NEGATIVE
Specific Gravity, UA: 1.01 (ref 1.005–1.030)
Urobilinogen, Ur: 0.2 mg/dL (ref 0.2–1.0)
pH, UA: 7 (ref 5.0–7.5)

## 2022-09-19 LAB — MICROSCOPIC EXAMINATION
Bacteria, UA: NONE SEEN
Casts: NONE SEEN /lpf
Epithelial Cells (non renal): NONE SEEN /hpf (ref 0–10)
RBC, Urine: NONE SEEN /hpf (ref 0–2)
WBC, UA: NONE SEEN /hpf (ref 0–5)

## 2022-09-20 ENCOUNTER — Other Ambulatory Visit: Payer: Self-pay

## 2022-09-20 DIAGNOSIS — Z7251 High risk heterosexual behavior: Secondary | ICD-10-CM

## 2022-09-20 LAB — GC/CHLAMYDIA PROBE AMP
Chlamydia trachomatis, NAA: NEGATIVE
Neisseria Gonorrhoeae by PCR: NEGATIVE

## 2022-09-21 ENCOUNTER — Ambulatory Visit: Payer: 59 | Admitting: Urology

## 2022-09-23 ENCOUNTER — Ambulatory Visit: Payer: 59 | Admitting: Urology

## 2022-09-23 ENCOUNTER — Encounter: Payer: Self-pay | Admitting: Urology

## 2022-09-23 VITALS — BP 135/96 | HR 91 | Ht 70.0 in | Wt 193.0 lb

## 2022-09-23 DIAGNOSIS — R590 Localized enlarged lymph nodes: Secondary | ICD-10-CM | POA: Diagnosis not present

## 2022-09-23 DIAGNOSIS — I861 Scrotal varices: Secondary | ICD-10-CM

## 2022-09-23 DIAGNOSIS — N521 Erectile dysfunction due to diseases classified elsewhere: Secondary | ICD-10-CM

## 2022-09-23 NOTE — Progress Notes (Signed)
09/23/2022 12:46 PM   Sharen Hint [birth year] [id number]  Referring provider: Montel Culver, MD [address] [place],  [place] [address]  Chief Complaint  Patient presents with   New Patient (Initial Visit)    Varicocele, " bump" on leg    HPI: 26 year old male referred for further evaluation of chronic left varicocele as well as inguinal adenopathy.  He reports that he had unprotected sex about 3 weeks ago.  He was subsequently seen by his PCP due to concern for enlarged groin lymph nodes, particularly on the left.  He was seen on 09/16/2022.  He underwent a battery of test including HIV, RPR, chlamydia gonorrhea, HSV all of which were negative.  He was treated with empiric Bactrim for 10 days.  He reports that the tenderness in his left groin is improved and the size of the lymph node is gotten smaller.  He denied any urethral discharge rashes or urinary discomfort.  He does have a known left chronic grade 2/3 varicocele.  He has been seen previously for the same issue.  He is elected conservative management.  Fertility is not an issue, he has a biological child.  He does not have much pain unless he is exercising but even then, he does not have the discomfort, Supranettes to wear his compressive undergarments.  He does wonder if his varicocele has anything to do with his low back pain.  He also mentions today that he had an episode of erectile dysfunction is worried about it.  This happened with a sexual encounter 3 weeks ago as well as occasionally with his ex-wife.  He reports that he believes that there is an emotional/psychological component to this as well.   PMH: Past Medical History:  Diagnosis Date   ADHD (attention deficit hyperactivity disorder)    Anxiety    Depression    Varicocele     Surgical History: No past surgical history on file.  Home Medications:  Allergies as of 09/23/2022   No Known Allergies      Medication List         Accurate as of September 23, 2022 12:46 PM. If you have any questions, ask your nurse or doctor.          hydrOXYzine 25 MG capsule Commonly known as: VISTARIL Take 1 capsule (25 mg total) by mouth every 8 (eight) hours as needed for anxiety.   sulfamethoxazole-trimethoprim 800-160 MG tablet Commonly known as: BACTRIM DS Take 1 tablet by mouth 2 (two) times daily.   VYVANSE PO Take 30 mg by mouth daily.        Allergies: No Known Allergies  Family History: Family History  Problem Relation Age of Onset   Breast cancer Mother    Breast cancer Maternal Grandmother    Parkinson's disease Paternal Grandfather    Allergies Daughter    Prostate cancer Neg Hx    Kidney cancer Neg Hx    Bladder Cancer Neg Hx     Social History:  reports that he quit smoking about 5 years ago. His smoking use included cigars. He has been exposed to tobacco smoke. He has quit using smokeless tobacco.  His smokeless tobacco use included chew. He reports current alcohol use of about 2.0 standard drinks of alcohol per week. He reports that he does not use drugs.   Physical Exam: BP (!) 135/96   Pulse 91   Ht 5\' 10"  (1.778 m)   Wt 193 lb (87.5 kg)  BMI 27.69 kg/m   Constitutional:  Alert and oriented, No acute distress. HEENT:  AT, moist mucus membranes.  Trachea midline, no masses. Cardiovascular: No clubbing, cyanosis, or edema. Respiratory: Normal respiratory effort, no increased work of breathing. GI: Abdomen is soft, nontender, nondistended, no abdominal masses GU: Bilateral descended testicles.  Left varicocele is visible through the scrotum when examined in the standing position.  Possibly also consistent with left varicocele.  Bilateral testicles are descended, symmetric, and nontender.  Uncircumcised phallus, easily retractable foreskin with no penile discharge. Neurologic: Grossly intact, no focal deficits, moving all 4 extremities. Psychiatric: Normal mood and affect.  Laboratory  Data: STI testing labs are all personally reviewed  Urinalysis Reviewed  Pertinent Imaging: Ultrasound from 11/2016 reviewed.  Assessment & Plan:    1. Varicocele Left varicocele, chronic minimally symptomatic  We discussed indications for repair including male factor infertility, pain and discomfort or testicular loss or atrophy.  He has none of these issues other than occasional dull pain and as such, would recommend continued observation which he agrees.  If he does become more symptomatic, would plan for referral to andrology.  2. Inguinal lymphadenopathy Improving on empiric antibiotics, etiology unclear however discussed the differential diagnosis  STI screening negative which is reassuring  3. Erectile dysfunction due to diseases classified elsewhere Agree, likely emotional/psychological component with personal history of anxiety depression and other stressors  I did offer him a trial of sildenafil discussed possible side effects.  Ultimately he declined but will let us know if he changes mind.   As needed  Vanna Scotland, MD  [facility] [address] [place], [place] [address] [phone]

## 2022-09-28 ENCOUNTER — Ambulatory Visit: Payer: 59 | Admitting: Urology

## 2022-09-30 ENCOUNTER — Ambulatory Visit: Payer: 59 | Admitting: Psychology

## 2023-04-03 ENCOUNTER — Encounter: Payer: Self-pay | Admitting: Family Medicine

## 2023-04-03 ENCOUNTER — Ambulatory Visit: Payer: Self-pay | Admitting: Family Medicine

## 2023-04-04 ENCOUNTER — Encounter: Payer: Self-pay | Admitting: Family Medicine

## 2023-04-04 ENCOUNTER — Telehealth (INDEPENDENT_AMBULATORY_CARE_PROVIDER_SITE_OTHER): Payer: No Typology Code available for payment source | Admitting: Family Medicine

## 2023-04-04 DIAGNOSIS — F4310 Post-traumatic stress disorder, unspecified: Secondary | ICD-10-CM | POA: Diagnosis not present

## 2023-04-04 MED ORDER — SERTRALINE HCL 50 MG PO TABS: 50.0000 mg | ORAL_TABLET | Freq: Every day | ORAL | 0 refills | Status: DC

## 2023-04-04 NOTE — Patient Instructions (Addendum)
-   Start sertraline daily - Can take with food in evening to minimize potential side effects - Coordinator will contact you to schedule visit with psychiatry - Return in 3 months for physical

## 2023-04-04 NOTE — Assessment & Plan Note (Signed)
Has been seeing counseling through [place] [facility], they raised concern for PTSD secondary to previous relationship (just completed divorce) and encouraged further management. We spent a length of time discussing treatment strategies, plan as follows:  - Start sertraline 50 mg daily (timeline for response reviewed) - Referral to psychiatry for CBT, further optimization - Return in 3 months for CPE, interim follow-up for this issue by psychiatry

## 2023-04-04 NOTE — Progress Notes (Signed)
Primary Care / Sports Medicine Virtual Visit  Patient Information:  Patient ID: Gabriel Cooper, male DOB: Apr 14, 1996 Age: 27 y.o. MRN: [id number]   Gabriel Cooper is a pleasant 27 y.o. male presenting with the following:  Chief Complaint  Patient presents with   Anxiety    Review of Systems: No fevers, chills, night sweats, weight loss, chest pain, or shortness of breath.   Patient Active Problem List   Diagnosis Date Noted   PTSD (post-traumatic stress disorder) 04/04/2023   Inguinal lymphadenopathy 09/16/2022   Anxiety and depression 04/12/2022   Varicocele 12/21/2021   Lumbar radiculopathy 12/21/2021   Past Medical History:  Diagnosis Date   ADHD (attention deficit hyperactivity disorder)    Anxiety    Depression    Varicocele    Outpatient Encounter Medications as of 04/04/2023  Medication Sig   sertraline (ZOLOFT) 50 MG tablet Take 1 tablet (50 mg total) by mouth daily.   hydrOXYzine (VISTARIL) 25 MG capsule Take 1 capsule (25 mg total) by mouth every 8 (eight) hours as needed for anxiety.   Lisdexamfetamine Dimesylate (VYVANSE PO) Take 30 mg by mouth daily.   sulfamethoxazole-trimethoprim (BACTRIM DS) 800-160 MG tablet Take 1 tablet by mouth 2 (two) times daily.   No facility-administered encounter medications on file as of 04/04/2023.   History reviewed. No pertinent surgical history.  Virtual Visit via MyChart Video:   I connected with Gabriel Cooper on 04/04/23 via MyChart Video and verified that I am speaking with the correct person using appropriate identifiers.   The limitations, risks, security and privacy concerns of performing an evaluation and management service by MyChart Video, including the higher likelihood of inaccurate diagnoses and treatments, and the availability of in person appointments were reviewed. The possible need of an additional face-to-face encounter for complete and high quality delivery of care was discussed. The patient was also made aware  that there may be a patient responsible charge related to this service. The patient expressed understanding and wishes to proceed.  Provider location is in medical facility. Patient location is at their work outdoors, different from provider location. People involved in care of the patient during this telehealth encounter were myself, my nurse/medical assistant, and my front office/scheduling team member.  Objective findings:   General: Speaking full sentences, no audible heavy breathing. Sounds alert and appropriately interactive. Well-appearing. Face symmetric. Extraocular movements intact. Pupils equal and round. No nasal flaring or accessory muscle use visualized.  Independent interpretation of notes and tests performed by another provider:   None  Pertinent History, Exam, Impression, and Recommendations:   PTSD (post-traumatic stress disorder) Has been seeing counseling through [place] [facility], they raised concern for PTSD secondary to previous relationship (just completed divorce) and encouraged further management. We spent a length of time discussing treatment strategies, plan as follows:  - Start sertraline 50 mg daily (timeline for response reviewed) - Referral to psychiatry for CBT, further optimization - Return in 3 months for CPE, interim follow-up for this issue by psychiatry  Orders & Medications Meds ordered this encounter  Medications   sertraline (ZOLOFT) 50 MG tablet    Sig: Take 1 tablet (50 mg total) by mouth daily.    Dispense:  90 tablet    Refill:  0   Orders Placed This Encounter  Procedures   Ambulatory referral to Psychiatry     I discussed the above assessment and treatment plan with the patient. The patient was provided an opportunity to ask questions and all  were answered. The patient agreed with the plan and demonstrated an understanding of the instructions.   The patient was advised to call back or seek an in-person evaluation if the symptoms worsen or  if the condition fails to improve as anticipated.   I provided a total time of 30 minutes including both face-to-face and non-face-to-face time on 04/04/2023 inclusive of time utilized for medical chart review, information gathering, care coordination with staff, and documentation completion.    Jerrol Banana, MD, [facility]   Primary Care Sports Medicine Primary Care and Sports Medicine at [facility]

## 2023-04-07 ENCOUNTER — Encounter: Payer: Self-pay | Admitting: Family Medicine

## 2023-06-30 ENCOUNTER — Other Ambulatory Visit: Payer: Self-pay | Admitting: Family Medicine

## 2023-06-30 DIAGNOSIS — F4310 Post-traumatic stress disorder, unspecified: Secondary | ICD-10-CM

## 2023-06-30 NOTE — Telephone Encounter (Signed)
Requested Prescriptions  Pending Prescriptions Disp Refills   sertraline (ZOLOFT) 50 MG tablet [Pharmacy Med Name: SERTRALINE 50MG  TABLETS] 90 tablet 0    Sig: TAKE 1 TABLET(50 MG) BY MOUTH DAILY     Psychiatry:  Antidepressants - SSRI - sertraline Failed - 06/30/2023  3:38 AM      Failed - AST in normal range and within 360 days    No results found for: "POCAST", "AST"       Failed - ALT in normal range and within 360 days    No results found for: "ALT", "LABALT", "POCALT"       Passed - Completed PHQ-2 or PHQ-9 in the last 360 days      Passed - Valid encounter within last 6 months    Recent Outpatient Visits           2 months ago PTSD (post-traumatic stress disorder)   [facility] Ashley Royalty, Ocie Bob, MD   9 months ago Unprotected sexual intercourse   [facility], Ocie Bob, MD   1 year ago Anxiety and depression   [facility] at [facility], Ocie Bob, MD   1 year ago Lumbar radiculopathy   [facility] at [facility], Ocie Bob, MD   1 year ago Lumbar radiculopathy   [facility] at [facility], Ocie Bob, MD       Future Appointments             In 5 days Ashley Royalty, Ocie Bob, MD [facility] Primary Care & Sports Medicine at [facility], [facility]

## 2023-07-05 ENCOUNTER — Encounter: Payer: Self-pay | Admitting: Family Medicine

## 2023-07-05 ENCOUNTER — Encounter: Payer: No Typology Code available for payment source | Admitting: Family Medicine

## 2023-07-05 ENCOUNTER — Ambulatory Visit (INDEPENDENT_AMBULATORY_CARE_PROVIDER_SITE_OTHER): Payer: No Typology Code available for payment source | Admitting: Family Medicine

## 2023-07-05 VITALS — BP 128/80 | HR 78 | Ht 70.0 in | Wt 199.0 lb

## 2023-07-05 DIAGNOSIS — F4310 Post-traumatic stress disorder, unspecified: Secondary | ICD-10-CM

## 2023-07-05 DIAGNOSIS — E559 Vitamin D deficiency, unspecified: Secondary | ICD-10-CM | POA: Diagnosis not present

## 2023-07-05 DIAGNOSIS — F32A Depression, unspecified: Secondary | ICD-10-CM

## 2023-07-05 DIAGNOSIS — Z0000 Encounter for general adult medical examination without abnormal findings: Secondary | ICD-10-CM | POA: Diagnosis not present

## 2023-07-05 DIAGNOSIS — F419 Anxiety disorder, unspecified: Secondary | ICD-10-CM

## 2023-07-05 DIAGNOSIS — Z1159 Encounter for screening for other viral diseases: Secondary | ICD-10-CM | POA: Diagnosis not present

## 2023-07-05 DIAGNOSIS — F5104 Psychophysiologic insomnia: Secondary | ICD-10-CM

## 2023-07-05 DIAGNOSIS — I861 Scrotal varices: Secondary | ICD-10-CM

## 2023-07-05 DIAGNOSIS — Z13220 Encounter for screening for lipoid disorders: Secondary | ICD-10-CM | POA: Diagnosis not present

## 2023-07-05 MED ORDER — QUVIVIQ 50 MG PO TABS: 1.0000 | ORAL_TABLET | Freq: Every evening | ORAL | 1 refills | Status: DC | PRN

## 2023-07-05 NOTE — Assessment & Plan Note (Signed)
Chronic, intermittently symptomatic, did establish with urology who advised follow-up as needed.  We will track this issue peripherally.

## 2023-07-05 NOTE — Assessment & Plan Note (Signed)
Has [facility] with [facility], performing cognitive behavioral therapy with reported steady improvement.  They have discussed diagnosis of PTSD and management options.  Patient is pursuing nonpharmacologic management at this stage.  Citing some associated sleep related concerns for which we reviewed treatment strategies.  Plan as follows: - Continue with psychiatry group for continued treatment - Trial nightly Quviviq on an as-needed basis for sleep - Contact us for any questions/concerns over the interim

## 2023-07-05 NOTE — Assessment & Plan Note (Signed)
Annual examination completed, risk stratification labs ordered, anticipatory guidance provided.  We will follow labs once resulted. 

## 2023-07-05 NOTE — Progress Notes (Signed)
Annual Physical Exam Visit  Patient Information:  Patient ID: Gabriel Cooper, male DOB: [birth year] Age: 27 y.o. MRN: [id number]   Subjective:   CC: Annual Physical Exam  HPI:  Gabriel Cooper is here for their annual physical.  I reviewed the past medical history, family history, social history, surgical history, and allergies today and changes were made as necessary.  Please see the problem list section below for additional details.  Past Medical History: Past Medical History:  Diagnosis Date   ADHD (attention deficit hyperactivity disorder)    Anxiety    Depression    Varicocele    Past Surgical History: History reviewed. No pertinent surgical history. Family History: Family History  Problem Relation Age of Onset   Breast cancer Mother    Breast cancer Maternal Grandmother    Parkinson's disease Paternal Grandfather    Allergies Daughter    Prostate cancer Neg Hx    Kidney cancer Neg Hx    Bladder Cancer Neg Hx    Allergies: No Known Allergies Health Maintenance: Health Maintenance  Topic Date Due   HPV VACCINES (1 - Male 3-dose series) Never done   Hepatitis C Screening  Never done   INFLUENZA VACCINE  07/06/2023   DTaP/Tdap/Td (2 - Td or Tdap) 10/22/2025   HIV Screening  Completed   COVID-19 Vaccine  Discontinued    HM Colonoscopy     This patient has no relevant Health Maintenance data.      Medications: Current Outpatient Medications on File Prior to Visit  Medication Sig Dispense Refill   lisdexamfetamine (VYVANSE) 30 MG chewable tablet Chew 30 mg by mouth daily.     Lisdexamfetamine Dimesylate (VYVANSE PO) Take 30 mg by mouth daily.     No current facility-administered medications on file prior to visit.    Review of Systems: No headache, visual changes, nausea, vomiting, diarrhea, constipation, dizziness, abdominal pain, skin rash, fevers, chills, night sweats, swollen lymph nodes, weight loss, chest pain, body aches, joint swelling, muscle  aches, shortness of breath, mood changes, visual or auditory hallucinations reported.  Objective:   Vitals:   07/05/23 1050  BP: 128/80  Pulse: 78  SpO2: 98%   Vitals:   07/05/23 1050  Weight: 199 lb (90.3 kg)  Height: 5\' 10"  (1.778 m)   Body mass index is 28.55 kg/m.  General: Well Developed, well nourished, and in no acute distress.  Neuro: Alert and oriented x3, extra-ocular muscles intact, sensation grossly intact. Cranial nerves II through XII are grossly intact, motor, sensory, and coordinative functions are intact. HEENT: Normocephalic, atraumatic, pupils equal round reactive to light, neck supple, no masses, no lymphadenopathy, thyroid nonpalpable. Oropharynx, nasopharynx, external ear canals are unremarkable. Skin: Warm and dry, no rashes noted.  Cardiac: Regular rate and rhythm, no murmurs rubs or gallops. No peripheral edema. Pulses symmetric. Respiratory: Clear to auscultation bilaterally. Not using accessory muscles, speaking in full sentences.  Abdominal: Soft, nontender, nondistended, positive bowel sounds, no masses, no organomegaly. Musculoskeletal: Shoulder, elbow, wrist, hip, knee, ankle stable, and with full range of motion.   Impression and Recommendations:   The patient was counselled, risk factors were discussed, and anticipatory guidance given.  Problem List Items Addressed This Visit       Cardiovascular and Mediastinum   Varicocele (Chronic)    Chronic, intermittently symptomatic, did establish with urology who advised follow-up as needed.  We will track this issue peripherally.        Other   PTSD (post-traumatic stress  disorder)    [facility] with [facility], performing cognitive behavioral therapy with reported steady improvement.  They have discussed diagnosis of PTSD and management options.  Patient is pursuing nonpharmacologic management at this stage.  Citing some associated sleep related concerns for which we reviewed  treatment strategies.  Plan as follows: - Continue with psychiatry group for continued treatment - Trial nightly Quviviq on an as-needed basis for sleep - Contact us for any questions/concerns over the interim      Healthcare maintenance - Primary    Annual examination completed, risk stratification labs ordered, anticipatory guidance provided.  We will follow labs once resulted.      Relevant Orders   CBC   Comprehensive metabolic panel   Hepatitis C antibody   Lipid panel   TSH   VITAMIN D 25 Hydroxy (Vit-D Deficiency, Fractures)   Anxiety and depression   Other Visit Diagnoses     Need for hepatitis C screening test       Relevant Orders   Hepatitis C antibody   Screening for lipoid disorders       Relevant Orders   Comprehensive metabolic panel   Lipid panel   Annual physical exam       Relevant Orders   CBC   Comprehensive metabolic panel   Hepatitis C antibody   Lipid panel   TSH   VITAMIN D 25 Hydroxy (Vit-D Deficiency, Fractures)   Vitamin D deficiency       Relevant Orders   VITAMIN D 25 Hydroxy (Vit-D Deficiency, Fractures)        Orders & Medications Medications: No orders of the defined types were placed in this encounter.  Orders Placed This Encounter  Procedures   CBC   Comprehensive metabolic panel   Hepatitis C antibody   Lipid panel   TSH   VITAMIN D 25 Hydroxy (Vit-D Deficiency, Fractures)     Return in about 1 year (around 07/04/2024) for CPE.    Jerrol Banana, MD, [facility]   Primary Care Sports Medicine Primary Care and Sports Medicine at [facility]

## 2023-07-05 NOTE — Patient Instructions (Addendum)
-   Obtain fasting labs with orders provided (can have water or black coffee but otherwise no food or drink x 8 hours before labs) - Review information provided - Attend eye doctor annually, dentist every 6 months, work towards or maintain 30 minutes of moderate intensity physical activity at least 5 days per week, and consume a balanced diet - Return in 1 year for physical - Contact us for any questions between now and then  Additionally: - Start wrist exercises, if still symptomatic contact us to coordinate next steps - Start Quviviq nightly as-needed - Review sleep hygiene information below and make changes where applicable  Sleep hygiene advice  When possible, maximize regularity in activity and sleep schedule   Regularity in the timing of sleep, food intake, and social activity helps to stabilize the biological clock.Minimizing discrepancies in sleep timing between on-shift and off-shift periods may help you adapt to a fixed-shift schedule and may also help you adapt to each shift type in a rotating-shift schedule (depending onrotation speed and direction).   Create a sleep-friendly bedroom environment   Make sure that your bed is comfortable and that your bedroom is dark, quiet, and cool (around 47F or 18C).Blackout shades may be particularly important to block sunlight during daytime sleep. Creating constantbackground noise in the sleep environment with a fan or humidifier, for example, will eliminate unexpectedsounds that would otherwise wake you up.   Limit exposure to bright light before daytime sleep   Exposure to bright light (eg, sunlight during the morning commute home following a night shift) can bealerting and may also set your biological clock to a time that interferes with daytime sleep.   Make the last hour before bed a "wind-down" time   Engage in relaxing and pleasant activities, dim or block light in the room, and have a light snack.   Do not use alcohol to help you  sleep and do not consume alcohol too close to bedtime   Although alcohol may help you to fall asleep more easily, it disrupts your sleep during the night by causingfrequent awakenings. One drink of alcohol should not be consumed within three hours of bedtime.   Smoking and other drugs will disrupt your sleep   If you smoke, do not smoke too close to bedtime or if you wake up during the intended sleep period. Mostdrugs of abuse can disrupt sleep.   Avoid caffeinated products within six hours of bedtime   In addition to coffee, these may include tea, chocolate, and many sodas.   Exercise regularly, but avoid activities that raise body temperature close to bedtime   Regular exercise can improve sleep quality, but exercising or having a warm bath too close to bedtime candisrupt your ability to fall asleep. Warm baths should be avoided within 1.5 hours of bedtime.   Avoid consuming more than 8 to 10 ounces of liquids close to bedtime   A full or semi-full bladder can contribute to awakenings. Restrict liquids close to bedtime and empty yourbladder just before going to bed.

## 2023-08-18 ENCOUNTER — Other Ambulatory Visit: Payer: Self-pay | Admitting: Family Medicine

## 2023-08-18 DIAGNOSIS — F4310 Post-traumatic stress disorder, unspecified: Secondary | ICD-10-CM

## 2023-08-21 NOTE — Telephone Encounter (Signed)
Refused Sertraline 50 mg because it was discontinued on 07/05/2023 and it's not on the medication list.

## 2024-01-10 ENCOUNTER — Encounter: Payer: Self-pay | Admitting: Family Medicine

## 2024-07-05 ENCOUNTER — Encounter: Payer: Self-pay | Admitting: Family Medicine

## 2024-07-05 ENCOUNTER — Ambulatory Visit (INDEPENDENT_AMBULATORY_CARE_PROVIDER_SITE_OTHER): Payer: Self-pay | Admitting: Family Medicine

## 2024-07-05 VITALS — BP 96/60 | HR 88 | Ht 70.0 in | Wt 203.2 lb

## 2024-07-05 DIAGNOSIS — F419 Anxiety disorder, unspecified: Secondary | ICD-10-CM

## 2024-07-05 DIAGNOSIS — F4310 Post-traumatic stress disorder, unspecified: Secondary | ICD-10-CM

## 2024-07-05 DIAGNOSIS — Z0000 Encounter for general adult medical examination without abnormal findings: Secondary | ICD-10-CM

## 2024-07-05 DIAGNOSIS — F909 Attention-deficit hyperactivity disorder, unspecified type: Secondary | ICD-10-CM | POA: Insufficient documentation

## 2024-07-05 DIAGNOSIS — J301 Allergic rhinitis due to pollen: Secondary | ICD-10-CM | POA: Insufficient documentation

## 2024-07-05 DIAGNOSIS — I861 Scrotal varices: Secondary | ICD-10-CM

## 2024-07-05 DIAGNOSIS — M5416 Radiculopathy, lumbar region: Secondary | ICD-10-CM

## 2024-07-05 DIAGNOSIS — F32A Depression, unspecified: Secondary | ICD-10-CM

## 2024-07-05 NOTE — Patient Instructions (Addendum)
-   Obtain fasting labs with orders provided (can have water or black coffee but otherwise no food or drink x 8 hours before labs) - Review information provided - Attend eye doctor annually, dentist every 6 months, work towards or maintain 30 minutes of moderate intensity physical activity at least 5 days per week, and consume a balanced diet - Return in 1 year for physical - Contact us  for any questions between now and then   Patient Plan for Post-Visit Guidance  Anxiety and Depression / Psychological Stress - Continue physical activity and mindfulness practices. - Consider behavioral therapy for additional support. Contact the office if interested. - Use your support system as needed and reach out for help if symptoms worsen.  ADHD - Follow up with psychiatry for ADHD management and to find an in-network provider with your insurance. - Check your insurance website or contact your current provider for assistance with finding an in-network doctor.  Healthcare Maintenance - Complete all ordered lab tests  - The office will follow up with you once results are available.  Lumbar Radiculopathy / Low Back Pain - Continue current exercises and stretches, focusing on glute and hamstring strength and proper form. - Avoid heavy lifting.  PTSD - Follow up with psychiatry as referred.  Seasonal Allergic Rhinitis - Use Flonase, two sprays in each nostril daily for seven days, then as needed. Restart if symptoms return. - Consider an oral antihistamine like Claritin or Zyrtec if symptoms persist. - Contact the office if symptoms do not improve and you wish to discuss an ENT referral.  Varicocele - Wear supportive clothing such as spandex during physical activity to manage symptoms.  Red Flags: - If you experience severe mood changes, thoughts of self-harm, new or worsening pain, numbness, tingling, or any new or concerning symptoms, contact the office immediately.

## 2024-07-05 NOTE — Assessment & Plan Note (Signed)
 Low back pain - Significant improvement with increased engagement in loaded stretches, glute and hamstring exercises, and focusing on form rather than lifting heavy weights - No radiation of pain to the legs - No numbness or tingling in the legs

## 2024-07-05 NOTE — Progress Notes (Signed)
 Annual Physical Exam Visit  Patient Information:  Patient ID: Gabriel Cooper, male DOB: May 03, 1996 Age: 28 y.o. MRN: [id number]   Subjective:   CC: Annual Physical Exam  HPI:  Gabriel Cooper is here for their annual physical.  I reviewed the past medical history, family history, social history, surgical history, and allergies today and changes were made as necessary.  Please see the problem list section below for additional details.  Past Medical History: Past Medical History:  Diagnosis Date   ADHD (attention deficit hyperactivity disorder)    Anxiety    Depression    Varicocele    Past Surgical History: History reviewed. No pertinent surgical history. Family History: Family History  Problem Relation Age of Onset   Breast cancer Mother    Breast cancer Maternal Grandmother    Parkinson's disease Paternal Grandfather    Allergies Daughter    Prostate cancer Neg Hx    Kidney cancer Neg Hx    Bladder Cancer Neg Hx    Allergies: No Known Allergies Health Maintenance: Health Maintenance  Topic Date Due   Hepatitis C Screening  Never done   Hepatitis B Vaccines (3 of 3 - 3-dose series) 08/17/2016   HPV VACCINES (1 - 3-dose SCDM series) 05/22/2024   INFLUENZA VACCINE  07/05/2024   DTaP/Tdap/Td (2 - Td or Tdap) 10/22/2025   HIV Screening  Completed   Meningococcal B Vaccine  Aged Out   COVID-19 Vaccine  Discontinued    HM Colonoscopy   This patient has no relevant Health Maintenance data.    Medications: Current Outpatient Medications on File Prior to Visit  Medication Sig Dispense Refill   Lisdexamfetamine Dimesylate (VYVANSE PO) Take 30 mg by mouth daily.     lisdexamfetamine (VYVANSE) 30 MG chewable tablet Chew 30 mg by mouth daily.     No current facility-administered medications on file prior to visit.    Objective:   Vitals:   07/05/24 1005  BP: 96/60  Pulse: 88  SpO2: 95%   Vitals:   07/05/24 1005  Weight: 203 lb 3.2 oz (92.2 kg)  Height: 5'  10 (1.778 m)   Body mass index is 29.16 kg/m.  General: Well Developed, well nourished, and in no acute distress.  Neuro: Alert and oriented x3, extra-ocular muscles intact, sensation grossly intact. Cranial nerves II through XII are grossly intact, motor, sensory, and coordinative functions are intact. HEENT: Normocephalic, atraumatic, neck supple, no masses, no lymphadenopathy, thyroid nonenlarged. Oropharynx, nasopharynx, external ear canals are unremarkable. Skin: Warm and dry, no rashes noted.  Cardiac: Regular rate and rhythm, no murmurs rubs or gallops. No peripheral edema. Pulses symmetric. Respiratory: Clear to auscultation bilaterally. Speaking in full sentences.  Abdominal: Soft, nontender, nondistended, positive bowel sounds, no masses, no organomegaly. Musculoskeletal: Stable, and with full range of motion.  Impression and Recommendations:   The patient was counselled, risk factors were discussed, and anticipatory guidance given.  Problem List Items Addressed This Visit     Anxiety and depression   Psychological stress - Stress levels fluctuate depending on workload and personal circumstances - Feels supported by church and faith, which has been beneficial - Currently involved in a legal situation concerning his daughter, which is a source of stress and frustration, particularly due to perceived racial prejudice  Depression and anxiety symptoms Mild depression and anxiety, GAD score 4, PHQ score 8. Symptoms fluctuate with stress. Strong support system and engages in physical activity and mindfulness. - Discuss benefits of behavioral therapy and  mindfulness. - Encourage continuation of physical activity and mindfulness. - Offer support and availability for further assistance. - Contact our office if interested in pursuing behavioral therapy      Relevant Orders   Ambulatory referral to Psychiatry   Attention deficit hyperactivity disorder (ADHD)   Attention-deficit  hyperactivity disorder (ADHD) ADHD managed with Vyvanse. Current prescriber out of network, needs referral to in-network provider. - Refer to psychiatry for in-network provider with [facility]. - Advise contacting current provider for in-network doctor in same group. - Suggest checking insurance website for [employer].      Healthcare maintenance - Primary   Annual examination completed, risk stratification labs ordered, anticipatory guidance provided.  We will follow labs once resulted.      Relevant Orders   CBC   Comprehensive metabolic panel with GFR   Hemoglobin A1c   Hepatitis C antibody   Lipid panel   Lumbar radiculopathy   Low back pain - Significant improvement with increased engagement in loaded stretches, glute and hamstring exercises, and focusing on form rather than lifting heavy weights - No radiation of pain to the legs - No numbness or tingling in the legs      PTSD (post-traumatic stress disorder)   Relevant Orders   Ambulatory referral to Psychiatry   Seasonal allergic rhinitis due to pollen   Allergic rhinitis  Intermittent allergic rhinitis with eustachian tube symptoms. Mild nasal inflammation observed. - Recommend Flonase, two sprays daily per nostril, x 7 days then as needed. - Consider oral antihistamine like Claritin or Zyrtec if symptoms persist. - Instruct Flonase use for seven days, restart if symptoms recur. - Consider ENT referral if no improvement.  Contact us  if needing referral.      Varicocele (Chronic)   Varicocele symptoms - Manages symptoms by wearing supportive clothing such as spandex during physical activities - No current symptoms        Orders & Medications Medications: No orders of the defined types were placed in this encounter.  Orders Placed This Encounter  Procedures   CBC   Comprehensive metabolic panel with GFR   Hemoglobin A1c   Hepatitis C antibody   Lipid panel   Ambulatory  referral to Psychiatry     Return in about 1 year (around 07/05/2025) for CPE.    Selinda JINNY Ku, MD, [facility]   Primary Care Sports Medicine Primary Care and Sports Medicine at [facility]

## 2024-07-05 NOTE — Assessment & Plan Note (Signed)
 Annual examination completed, risk stratification labs ordered, anticipatory guidance provided.  We will follow labs once resulted.

## 2024-07-05 NOTE — Assessment & Plan Note (Signed)
 Varicocele symptoms - Manages symptoms by wearing supportive clothing such as spandex during physical activities - No current symptoms

## 2024-07-05 NOTE — Assessment & Plan Note (Signed)
 Attention-deficit hyperactivity disorder (ADHD) ADHD managed with Vyvanse. Current prescriber out of network, needs referral to in-network provider. - Refer to psychiatry for in-network provider with [facility]. - Advise contacting current provider for in-network doctor in same group. - Suggest checking insurance website for [employer].

## 2024-07-05 NOTE — Assessment & Plan Note (Signed)
 Psychological stress - Stress levels fluctuate depending on workload and personal circumstances - Feels supported by church and faith, which has been beneficial - Currently involved in a legal situation concerning his daughter, which is a source of stress and frustration, particularly due to perceived racial prejudice  Depression and anxiety symptoms Mild depression and anxiety, GAD score 4, PHQ score 8. Symptoms fluctuate with stress. Strong support system and engages in physical activity and mindfulness. - Discuss benefits of behavioral therapy and mindfulness. - Encourage continuation of physical activity and mindfulness. - Offer support and availability for further assistance. - Contact our office if interested in pursuing behavioral therapy

## 2024-07-05 NOTE — Assessment & Plan Note (Signed)
 Allergic rhinitis  Intermittent allergic rhinitis with eustachian tube symptoms. Mild nasal inflammation observed. - Recommend Flonase, two sprays daily per nostril, x 7 days then as needed. - Consider oral antihistamine like Claritin or Zyrtec if symptoms persist. - Instruct Flonase use for seven days, restart if symptoms recur. - Consider ENT referral if no improvement.  Contact us  if needing referral.

## 2024-07-06 LAB — CBC
Hematocrit: 42.6 % (ref 37.5–51.0)
Hemoglobin: 13.8 g/dL (ref 13.0–17.7)
MCH: 30.3 pg (ref 26.6–33.0)
MCHC: 32.4 g/dL (ref 31.5–35.7)
MCV: 94 fL (ref 79–97)
Platelets: 257 x10E3/uL (ref 150–450)
RBC: 4.55 x10E6/uL (ref 4.14–5.80)
RDW: 12 % (ref 11.6–15.4)
WBC: 6.9 x10E3/uL (ref 3.4–10.8)

## 2024-07-06 LAB — LIPID PANEL
Chol/HDL Ratio: 2.4 ratio (ref 0.0–5.0)
Cholesterol, Total: 149 mg/dL (ref 100–199)
HDL: 62 mg/dL (ref >39)
LDL Chol Calc (NIH): 69 mg/dL (ref 0–99)
Triglycerides: 98 mg/dL (ref 0–149)
VLDL Cholesterol Cal: 18 mg/dL (ref 5–40)

## 2024-07-06 LAB — COMPREHENSIVE METABOLIC PANEL WITH GFR
ALT: 21 IU/L (ref 0–44)
AST: 23 IU/L (ref 0–40)
Albumin: 4.6 g/dL (ref 4.3–5.2)
Alkaline Phosphatase: 68 IU/L (ref 44–121)
BUN/Creatinine Ratio: 18 (ref 9–20)
BUN: 21 mg/dL — ABNORMAL HIGH (ref 6–20)
Bilirubin Total: 0.5 mg/dL (ref 0.0–1.2)
CO2: 23 mmol/L (ref 20–29)
Calcium: 9.6 mg/dL (ref 8.7–10.2)
Chloride: 101 mmol/L (ref 96–106)
Creatinine, Ser: 1.15 mg/dL (ref 0.76–1.27)
Globulin, Total: 2.5 g/dL (ref 1.5–4.5)
Glucose: 91 mg/dL (ref 70–99)
Potassium: 3.9 mmol/L (ref 3.5–5.2)
Sodium: 139 mmol/L (ref 134–144)
Total Protein: 7.1 g/dL (ref 6.0–8.5)
eGFR: 89 mL/min/1.73 (ref >59)

## 2024-07-06 LAB — HEMOGLOBIN A1C
Est. average glucose Bld gHb Est-mCnc: 103 mg/dL
Hgb A1c MFr Bld: 5.2 % (ref 4.8–5.6)

## 2024-07-06 LAB — HEPATITIS C ANTIBODY: Hep C Virus Ab: NONREACTIVE

## 2024-07-17 ENCOUNTER — Ambulatory Visit: Payer: Self-pay | Admitting: Family Medicine

## 2024-07-17 ENCOUNTER — Encounter: Payer: Self-pay | Admitting: Family Medicine

## 2024-07-17 NOTE — Telephone Encounter (Signed)
 Please review patient response.  JM

## 2025-07-08 ENCOUNTER — Encounter: Admitting: Family Medicine
